# Patient Record
Sex: Female | Born: 2000 | Race: White | Hispanic: No | Marital: Single
Health system: Western US, Academic
[De-identification: ages and names within clinical notes are randomized; demographics above are authoritative.]

## PROBLEM LIST (undated history)

## (undated) ENCOUNTER — Emergency Department (HOSPITAL_COMMUNITY): Admission: EM | Payer: Medicaid Other

## (undated) DIAGNOSIS — R42 Dizziness and giddiness: Secondary | ICD-10-CM

## (undated) DIAGNOSIS — D649 Anemia, unspecified: Secondary | ICD-10-CM

## (undated) DIAGNOSIS — F32A Depression, unspecified: Secondary | ICD-10-CM

## (undated) DIAGNOSIS — R87629 Unspecified abnormal cytological findings in specimens from vagina: Secondary | ICD-10-CM

## (undated) DIAGNOSIS — E282 Polycystic ovarian syndrome: Secondary | ICD-10-CM

## (undated) DIAGNOSIS — N390 Urinary tract infection, site not specified: Secondary | ICD-10-CM

## (undated) DIAGNOSIS — J45909 Unspecified asthma, uncomplicated: Secondary | ICD-10-CM

## (undated) HISTORY — PX: NO PAST SURGERIES: SHX2092

---
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
## 2019-11-07 ENCOUNTER — Emergency Department (HOSPITAL_COMMUNITY)
Admission: EM | Admit: 2019-11-07 | Discharge: 2019-11-07 | Disposition: A | Discharge status: Home / Self Care | Payer: Medicaid Other | Attending: Emergency Medicine | Admitting: Emergency Medicine

## 2019-11-07 ENCOUNTER — Other Ambulatory Visit: Payer: Self-pay

## 2019-11-07 ENCOUNTER — Emergency Department (HOSPITAL_COMMUNITY): Payer: Medicaid Other

## 2019-11-07 ENCOUNTER — Encounter (HOSPITAL_COMMUNITY): Payer: Self-pay

## 2019-11-07 DIAGNOSIS — B3731 Acute candidiasis of vulva and vagina: Secondary | ICD-10-CM

## 2019-11-07 DIAGNOSIS — B373 Candidiasis of vulva and vagina: Secondary | ICD-10-CM | POA: Insufficient documentation

## 2019-11-07 DIAGNOSIS — O26891 Other specified pregnancy related conditions, first trimester: Secondary | ICD-10-CM | POA: Diagnosis present

## 2019-11-07 DIAGNOSIS — Z3A01 Less than 8 weeks gestation of pregnancy: Secondary | ICD-10-CM | POA: Insufficient documentation

## 2019-11-07 DIAGNOSIS — Z3491 Encounter for supervision of normal pregnancy, unspecified, first trimester: Secondary | ICD-10-CM

## 2019-11-07 DIAGNOSIS — J45909 Unspecified asthma, uncomplicated: Secondary | ICD-10-CM | POA: Diagnosis not present

## 2019-11-07 DIAGNOSIS — R1030 Lower abdominal pain, unspecified: Secondary | ICD-10-CM | POA: Diagnosis not present

## 2019-11-07 DIAGNOSIS — N760 Acute vaginitis: Secondary | ICD-10-CM | POA: Insufficient documentation

## 2019-11-07 DIAGNOSIS — B9689 Other specified bacterial agents as the cause of diseases classified elsewhere: Secondary | ICD-10-CM | POA: Insufficient documentation

## 2019-11-07 DIAGNOSIS — O26899 Other specified pregnancy related conditions, unspecified trimester: Secondary | ICD-10-CM

## 2019-11-07 HISTORY — DX: Dizziness and giddiness: R42

## 2019-11-07 HISTORY — DX: Unspecified asthma, uncomplicated: J45.909

## 2019-11-07 LAB — URINALYSIS, ROUTINE W REFLEX MICROSCOPIC
Bilirubin Urine: NEGATIVE
Glucose, UA: NEGATIVE mg/dL
Hgb urine dipstick: NEGATIVE
Ketones, ur: NEGATIVE mg/dL
Leukocytes,Ua: NEGATIVE
Nitrite: NEGATIVE
Protein, ur: NEGATIVE mg/dL
Specific Gravity, Urine: 1.012 (ref 1.005–1.030)
pH: 7 (ref 5.0–8.0)

## 2019-11-07 LAB — HCG, QUANTITATIVE, PREGNANCY: hCG, Beta Chain, Quant, S: 21583 m[IU]/mL — ABNORMAL HIGH (ref <5)

## 2019-11-07 LAB — PREGNANCY, URINE: Preg Test, Ur: POSITIVE — AB

## 2019-11-07 LAB — I-STAT BETA HCG BLOOD, ED (MC, WL, AP ONLY): I-stat hCG, quantitative: >2000 m[IU]/mL — ABNORMAL HIGH (ref <5)

## 2019-11-07 LAB — WET PREP, GENITAL
Sperm: NONE SEEN
Trich, Wet Prep: NONE SEEN

## 2019-11-07 MED ORDER — METRONIDAZOLE 0.75 % VA GEL: 1.0000 | Freq: Two times a day (BID) | VAGINAL | 0 refills | Status: DC

## 2019-11-07 MED ORDER — SODIUM CHLORIDE 0.9% FLUSH: 3.0000 mL | Freq: Once | INTRAVENOUS | Status: DC

## 2019-11-07 MED ORDER — FLUCONAZOLE 150 MG PO TABS: 150.0000 mg | ORAL_TABLET | Freq: Once | ORAL | 0 refills | Status: AC

## 2019-11-07 MED ORDER — FLUCONAZOLE 150 MG PO TABS: 150.0000 mg | ORAL_TABLET | Freq: Once | ORAL | Status: DC

## 2019-11-07 NOTE — Discharge Instructions (Addendum)
Swabs showed bacterial vaginosis and yeast. Use intravaginal gel for BV. One dose of fluconazole for yeast given here, take a repeat dose in 72 hours if vaginal discharge from yeast continues.   Ultrasound confirmed a single intrauterine pregnancy in the uterus approximately 6 week and 2 days with due date of 06/30/2020  Please go to your OB appointments as schedule, discuss recurrent BV and yeast  Return for sudden severe constant pelvic pain, vaginal bleeding

## 2019-11-07 NOTE — ED Provider Notes (Signed)
[facility]-EMERGENCY DEPT Provider Note   CSN: [id number] Arrival date & time: 11/07/19  0944     History Chief Complaint  Patient presents with  . Abdominal Pain    Anna Vance is a 19 y.o. female presents to ER for concern of ectopic pregnancy. Patient is approximately [redacted] weeks pregnant.  First day of LMP June 20th.  She reports intermittent low abdominal pain both on right and left but mostly on the right side.  This has been going on for about 2 weeks.  The pain feels like a crampy ache. It is fleeting only lasting a few seconds.  Feels mild.  Currently no pain here.  Went to ED 2 weeks ago when she was 4 weeks for this and was told they couldn't evaluate for ectopic because it was too early.  A few days ago she had vaginal discharge that felt like BV.  She used intravaginal flagyl once and then felt like she got a yeast infection from it so she stopped the flagyl.  Noticed pink tinged discharge at one point but this resolved.  Currently feels like her vaginal discharge is normal.  Reports long history of recurrent BV for 3 years. She always gets yeast infection with flagyl.  Describes frustration regarding this issue.  states only boric acid works but now can't use due to pregnancy.  Denies fever. Mild nausea, no vomiting. No urinary symptoms. No abnormal vaginal bleeding or spotting. No diarrhea or constipation. No abdominal surgeries. One previous pregnancy, son is now 49 years old.  Uncomplicated first pregnancy and delivery.  She is requesting an [place] because she is too worried about an ectopic pregnancy.  She has upcoming OBGYN appointment and [place] on 8/11 in [place] but will be moving here and has appointment with central [facility] as well. No concern for STD.  Had recent STD testing that was negative.  Sexually active without condom use.   HPI     Past Medical History:  Diagnosis Date  . Asthma   . Vertigo     There are no problems to  display for this patient.   History reviewed. No pertinent surgical history.   OB History    Gravida  1   Para      Term      Preterm      AB      Living        SAB      TAB      Ectopic      Multiple      Live Births              History reviewed. No pertinent family history.  Social History   Tobacco Use  . Smoking status: Never Smoker  . Smokeless tobacco: Never Used  Vaping Use  . Vaping Use: Never used  Substance Use Topics  . Alcohol use: Never  . Drug use: Never    Home Medications Prior to Admission medications   Medication Sig Start Date End Date Taking? Authorizing Provider  albuterol (VENTOLIN HFA) 108 (90 Base) MCG/ACT inhaler Inhale 2 puffs into the lungs every 6 (six) hours as needed for wheezing or shortness of breath.   Yes [provider]  EPINEPHrine 0.3 mg/0.3 mL IJ SOAJ injection Inject 0.3 mg into the muscle as needed for anaphylaxis.    Yes [provider]  Prenatal Vit-Fe Fumarate-FA (MULTIVITAMIN-PRENATAL) 27-0.8 MG TABS tablet Take 1 tablet by mouth daily at  12 noon.   Yes [provider]  fluconazole (DIFLUCAN) 150 MG tablet Take 1 tablet (150 mg total) by mouth once for 1 dose. Take in 72 hours if symptoms of yeast infection have not improved 11/07/19 11/07/19  Liberty HandyGibbons, Tahji Odessa J, PA-C  metroNIDAZOLE (METROGEL) 0.75 % vaginal gel Place 1 Applicatorful vaginally 2 (two) times daily. 11/07/19   Liberty HandyGibbons, Jaelee Laughter J, PA-C    Allergies    Chocolate, Other, and Penicillins  Review of Systems   Review of Systems  Genitourinary: Positive for pelvic pain (none currently).  All other systems reviewed and are negative.   Physical Exam Updated Vital Signs BP 111/69   Pulse 72   Temp 98 F (36.7 C) (Oral)   Resp 18   Ht 5\' 1"  (1.549 m)   Wt 63.5 kg   LMP 09/25/2019   SpO2 100%   BMI 26.45 kg/m   Physical Exam Vitals and nursing note reviewed.  Constitutional:      General: She is not in acute  distress.    Appearance: She is well-developed.     Comments: NAD.  HENT:     Head: Normocephalic and atraumatic.     Right Ear: External ear normal.     Left Ear: External ear normal.     Nose: Nose normal.  Eyes:     General: No scleral icterus.    Conjunctiva/sclera: Conjunctivae normal.  Cardiovascular:     Rate and Rhythm: Normal rate.  Pulmonary:     Effort: Pulmonary effort is normal.  Abdominal:     General: Abdomen is flat.     Palpations: Abdomen is soft.     Tenderness: There is no abdominal tenderness.     Comments: No lower/suprapubic/CVA tenderness   Genitourinary:    Comments:  Exam performed with RN at bedside for assistance. External genitalia without lesions.  No groin lymphadenopathy.  Vaginal mucosa and cervix pink without lesions.  Scant likely physiologic clear/white discharge in vaginal vault noted.  No CMT.  Nonpalpable, nontender adnexa.  Perianal skin normal without lesions. Musculoskeletal:        General: No deformity. Normal range of motion.     Cervical back: Normal range of motion and neck supple.  Skin:    General: Skin is warm and dry.     Capillary Refill: Capillary refill takes less than 2 seconds.  Neurological:     Mental Status: She is alert and oriented to person, place, and time.  Psychiatric:        Behavior: Behavior normal.        Thought Content: Thought content normal.        Judgment: Judgment normal.     ED Results / Procedures / Treatments   Labs (all labs ordered are listed, but only abnormal results are displayed) Labs Reviewed  WET PREP, GENITAL - Abnormal; Notable for the following components:      Result Value   Yeast Wet Prep HPF POC PRESENT (*)    Clue Cells Wet Prep HPF POC PRESENT (*)    WBC, Wet Prep HPF POC RARE (*)    All other components within normal limits  HCG, QUANTITATIVE, PREGNANCY - Abnormal; Notable for the following components:   hCG, Beta Chain, Quant, S 21,583 (*)    All other components within  normal limits  PREGNANCY, URINE - Abnormal; Notable for the following components:   Preg Test, Ur POSITIVE (*)    All other components within normal limits  I-STAT BETA  HCG BLOOD, ED ([facility], [facility], AP ONLY) - Abnormal; Notable for the following components:   I-stat hCG, quantitative >2,000.0 (*)    All other components within normal limits  URINALYSIS, ROUTINE W REFLEX MICROSCOPIC  GC/CHLAMYDIA PROBE AMP ([place]) NOT AT [facility]    EKG None  Radiology US OB Comp < 14 Wks  Result Date: 11/07/2019 CLINICAL DATA:  19 year old pregnant female with pelvic pain. LMP: 09/25/2019 corresponding to an estimated gestational age of [redacted] weeks, 1 day. EXAM: OBSTETRIC <14 WK [place] AND TRANSVAGINAL OB US TECHNIQUE: Both transabdominal and transvaginal ultrasound examinations were performed for complete evaluation of the gestation as well as the maternal uterus, adnexal regions, and pelvic cul-de-sac. Transvaginal technique was performed to assess early pregnancy. COMPARISON:  None. FINDINGS: Intrauterine gestational sac: Single intrauterine gestational sac. Yolk sac:  Seen Embryo:  Present Cardiac Activity: Detected Heart Rate: 108 bpm CRL: 5 mm   6 w   2 d                  [place] EDC: 06/30/2020 Subchorionic hemorrhage:  Small subchorionic hemorrhage. Maternal uterus/adnexae: The maternal ovaries are unremarkable. There is a corpus luteum in the right ovary. Small free fluid in the pelvis. IMPRESSION: Single live intrauterine pregnancy with an estimated gestational age of [redacted] weeks, 2 days. Electronically Signed   By: Elgie Collard M.D.   On: 11/07/2019 19:41   US OB Transvaginal  Result Date: 11/07/2019 CLINICAL DATA:  19 year old pregnant female with pelvic pain. LMP: 09/25/2019 corresponding to an estimated gestational age of [redacted] weeks, 1 day. EXAM: OBSTETRIC <14 WK [place] AND TRANSVAGINAL OB US TECHNIQUE: Both transabdominal and transvaginal ultrasound examinations were performed for complete evaluation of the gestation as  well as the maternal uterus, adnexal regions, and pelvic cul-de-sac. Transvaginal technique was performed to assess early pregnancy. COMPARISON:  None. FINDINGS: Intrauterine gestational sac: Single intrauterine gestational sac. Yolk sac:  Seen Embryo:  Present Cardiac Activity: Detected Heart Rate: 108 bpm CRL: 5 mm   6 w   2 d                  [place] EDC: 06/30/2020 Subchorionic hemorrhage:  Small subchorionic hemorrhage. Maternal uterus/adnexae: The maternal ovaries are unremarkable. There is a corpus luteum in the right ovary. Small free fluid in the pelvis. IMPRESSION: Single live intrauterine pregnancy with an estimated gestational age of [redacted] weeks, 2 days. Electronically Signed   By: Elgie Collard M.D.   On: 11/07/2019 19:41    Procedures Procedures (including critical care time)  Medications Ordered in ED Medications  sodium chloride flush (NS) 0.9 % injection 3 mL (3 mLs Intravenous Not Given 11/07/19 1736)  fluconazole (DIFLUCAN) tablet 150 mg (has no administration in time range)    ED Course  I have reviewed the triage vital signs and the nursing notes.  Pertinent labs & imaging results that were available during my care of the patient were reviewed by me and considered in my medical decision making (see chart for details).  Clinical Course as of Nov 07 2034  Mon Nov 07, 2019  1638 Yeast Wet Prep HPF POC(!): PRESENT [CG]  1638 Clue Cells Wet Prep HPF POC(!): PRESENT [CG]    Clinical Course User Index [CG] Jerrell Mylar   MDM Rules/Calculators/A&P                          19 year old female here with concern of ectopic pregnancy,  requesting ultrasound. Has fleeting pelvic pain x 2 weeks.   Vital signs normal.  She has no current abdominal or pelvic pain here.  Abdominal and pelvic exam benign.  She has no red flags like fever, abnormal vaginal discharge, abnormal vaginal bleeding.  No history of ectopic pregnancy in the past.  Uncomplicated first pregnancy and  delivery.  Based on her LMP she is approximately [redacted] weeks gestational age.  I discussed with patient given her lack of symptoms, current pain and gestational age ectopic pregnancy is very unlikely.  Also, unlikely to visualize anything on ultrasound.  She has scheduled ultrasound for next week.  However, patient states she is here for ultrasound and would like one to feel better about it.  Ultrasound ordered.   ER work up personally interpreted.  Wet prep with yeast and clue cells.  History of recurrent BV and yeast, will give metronidazole intravaginal gel and fluconazole.  Urinalysis obtained in triage without any bacteria.  She declined STD treatment.  hCG quant is greater than 21,000.  OB ultrasound pending.  Anticipate discharge.  She has OB follow-up.  Return precautions discussed.  2035: [place] confirms single IUP with cardiac activity, small subchorionic hemorrhage.  Discussed with patient. She is to fu with OBGYN as scheduled. Recommended tylenol and activity modification for pelvic pain. Return precautions given.    Final Clinical Impression(s) / ED Diagnoses Final diagnoses:  Pelvic pain in pregnancy  First trimester pregnancy  Bacterial vaginosis  Yeast vaginitis    Rx / DC Orders ED Discharge Orders         Ordered    metroNIDAZOLE (METROGEL) 0.75 % vaginal gel  2 times daily     Discontinue  Reprint     11/07/19 2014    fluconazole (DIFLUCAN) 150 MG tablet   Once     Discontinue  Reprint     11/07/19 2014           Liberty Handy, PA-C 11/07/19 2036    Virgina Norfolk, DO 11/07/19 2059

## 2019-11-07 NOTE — ED Notes (Signed)
US at bedside

## 2019-11-07 NOTE — ED Triage Notes (Signed)
Patient c/o right abdominal pain x 2 days. Patient states she is [redacted] weeks pregnant.

## 2019-11-08 LAB — GC/CHLAMYDIA PROBE AMP (~~LOC~~) NOT AT ARMC
Chlamydia: NEGATIVE
Comment: NEGATIVE
Comment: NORMAL
Neisseria Gonorrhea: NEGATIVE

## 2019-12-13 LAB — OB RESULTS CONSOLE RPR
RPR: NONREACTIVE
RPR: NONREACTIVE
RPR: NONREACTIVE

## 2019-12-13 LAB — OB RESULTS CONSOLE HEPATITIS B SURFACE ANTIGEN
Hepatitis B Surface Ag: NEGATIVE
Hepatitis B Surface Ag: NEGATIVE
Hepatitis B Surface Ag: NEGATIVE
Hepatitis B Surface Ag: NEGATIVE

## 2019-12-13 LAB — OB RESULTS CONSOLE GC/CHLAMYDIA
Chlamydia: NEGATIVE
Chlamydia: NEGATIVE
Chlamydia: NEGATIVE
Gonorrhea: NEGATIVE
Gonorrhea: NEGATIVE
Gonorrhea: NEGATIVE

## 2019-12-13 LAB — OB RESULTS CONSOLE RUBELLA ANTIBODY, IGM
Rubella: IMMUNE
Rubella: IMMUNE
Rubella: IMMUNE
Rubella: IMMUNE

## 2019-12-13 LAB — OB RESULTS CONSOLE HIV ANTIBODY (ROUTINE TESTING)
HIV: NONREACTIVE
HIV: NONREACTIVE
HIV: NONREACTIVE

## 2019-12-13 LAB — OB RESULTS CONSOLE ABO/RH: RH Type: POSITIVE

## 2019-12-13 LAB — OB RESULTS CONSOLE ANTIBODY SCREEN: Antibody Screen: NEGATIVE

## 2019-12-30 ENCOUNTER — Encounter (HOSPITAL_COMMUNITY): Payer: Self-pay | Admitting: Obstetrics and Gynecology

## 2019-12-30 ENCOUNTER — Other Ambulatory Visit: Payer: Self-pay

## 2019-12-30 ENCOUNTER — Inpatient Hospital Stay (HOSPITAL_COMMUNITY)
Admission: AD | Admit: 2019-12-30 | Discharge: 2019-12-31 | Disposition: A | Discharge status: Home / Self Care | Payer: Medicaid Other | Attending: Obstetrics and Gynecology | Admitting: Obstetrics and Gynecology

## 2019-12-30 DIAGNOSIS — O219 Vomiting of pregnancy, unspecified: Secondary | ICD-10-CM | POA: Insufficient documentation

## 2019-12-30 DIAGNOSIS — O99891 Other specified diseases and conditions complicating pregnancy: Secondary | ICD-10-CM

## 2019-12-30 DIAGNOSIS — J45909 Unspecified asthma, uncomplicated: Secondary | ICD-10-CM | POA: Insufficient documentation

## 2019-12-30 DIAGNOSIS — Z79899 Other long term (current) drug therapy: Secondary | ICD-10-CM | POA: Insufficient documentation

## 2019-12-30 DIAGNOSIS — O99511 Diseases of the respiratory system complicating pregnancy, first trimester: Secondary | ICD-10-CM | POA: Insufficient documentation

## 2019-12-30 DIAGNOSIS — O26891 Other specified pregnancy related conditions, first trimester: Secondary | ICD-10-CM | POA: Insufficient documentation

## 2019-12-30 DIAGNOSIS — Z3A13 13 weeks gestation of pregnancy: Secondary | ICD-10-CM | POA: Insufficient documentation

## 2019-12-30 DIAGNOSIS — O26811 Pregnancy related exhaustion and fatigue, first trimester: Secondary | ICD-10-CM

## 2019-12-30 DIAGNOSIS — R519 Headache, unspecified: Secondary | ICD-10-CM | POA: Insufficient documentation

## 2019-12-30 DIAGNOSIS — M549 Dorsalgia, unspecified: Secondary | ICD-10-CM

## 2019-12-30 LAB — URINALYSIS, ROUTINE W REFLEX MICROSCOPIC
Bacteria, UA: NONE SEEN
Bilirubin Urine: NEGATIVE
Glucose, UA: NEGATIVE mg/dL
Hgb urine dipstick: NEGATIVE
Ketones, ur: NEGATIVE mg/dL
Leukocytes,Ua: NEGATIVE
Nitrite: NEGATIVE
Protein, ur: NEGATIVE mg/dL
Specific Gravity, Urine: 1.01 (ref 1.005–1.030)
pH: 6 (ref 5.0–8.0)

## 2019-12-30 NOTE — MAU Provider Note (Signed)
History     CSN: [id number]  Arrival date and time: 12/30/19 2036   First Provider Initiated Contact with Patient 12/30/19 2359      Chief Complaint  Patient presents with  . Emesis  . Headache   Anna Vance is a 19 y.o. G2P1001 at [redacted]w[redacted]d who receives care at [facility].  She presents today for Emesis and Headache.  She states she has been experiencing N/V on and off for a few weeks.  She states for the past 2 days it has worsened and she called her office.  She reports she was sent in Diclegis, but did not pick it up today.  She states she has tried Vitamin B6 and Unisom without relief.  She states she has been hydrating without issues, but has been unable to eat anything.  She reports she tried to eat a biscuit prior to arrival and grapes earlier.  She states she had vomiting after the grapes, but not after the biscuit.  Patient states she "just feels bad."  She reports she has been having dizzy spells that make her feel "hot all over and like I am about to pass out."  Patient states this has been ongoing.  Patient reports she worked yesterday and had a dizzy spell that resolved with "leaning over."     OB History    Gravida  2   Para  1   Term      Preterm      AB      Living  1     SAB      TAB      Ectopic      Multiple      Live Births              Past Medical History:  Diagnosis Date  . Asthma   . Vertigo     Past Surgical History:  Procedure Laterality Date  . NO PAST SURGERIES      No family history on file.  Social History   Tobacco Use  . Smoking status: Never Smoker  . Smokeless tobacco: Never Used  Vaping Use  . Vaping Use: Never used  Substance Use Topics  . Alcohol use: Never  . Drug use: Never    Allergies:  Allergies  Allergen Reactions  . Chocolate   . Other     Peanuts and strawberries  . Penicillins     Did it involve swelling of the face/tongue/throat, SOB, or low BP? Unknown Did it  involve sudden or severe rash/hives, skin peeling, or any reaction on the inside of your mouth or nose? Unknown Did you need to seek medical attention at a hospital or doctor's office? Unknown When did it last happen?child If all above answers are "NO", may proceed with cephalosporin use.      Medications Prior to Admission  Medication Sig Dispense Refill Last Dose  . Prenatal Vit-Fe Fumarate-FA (MULTIVITAMIN-PRENATAL) 27-0.8 MG TABS tablet Take 1 tablet by mouth daily at 12 noon.   12/30/2019 at Unknown time  . albuterol (VENTOLIN HFA) 108 (90 Base) MCG/ACT inhaler Inhale 2 puffs into the lungs every 6 (six) hours as needed for wheezing or shortness of breath.   Unknown at Unknown time  . EPINEPHrine 0.3 mg/0.3 mL IJ SOAJ injection Inject 0.3 mg into the muscle as needed for anaphylaxis.      Marland Kitchen metroNIDAZOLE (METROGEL) 0.75 % vaginal gel Place 1 Applicatorful vaginally 2 (two) times daily. 70 g  0     Review of Systems  Constitutional: Negative for chills and fever.  Respiratory: Negative for cough and shortness of breath.   Gastrointestinal: Positive for constipation, nausea and vomiting. Negative for diarrhea.  Genitourinary: Negative for difficulty urinating, dysuria, vaginal bleeding and vaginal discharge.  Neurological: Positive for dizziness and headaches (6/10). Negative for light-headedness.   Physical Exam   Blood pressure (!) 109/54, pulse 87, temperature 98.7 F (37.1 C), temperature source Oral, resp. rate 17, height 5\' 2"  (1.575 m), weight 60.3 kg, last menstrual period 09/25/2019, SpO2 100 %.  Physical Exam Vitals and nursing note reviewed.  Constitutional:      Appearance: Normal appearance. She is well-developed.  HENT:     Head: Normocephalic and atraumatic.  Eyes:     Conjunctiva/sclera: Conjunctivae normal.  Cardiovascular:     Rate and Rhythm: Normal rate and regular rhythm.     Heart sounds: Normal heart sounds.  Pulmonary:     Effort: Pulmonary  effort is normal. No respiratory distress.     Breath sounds: Normal breath sounds.  Musculoskeletal:        General: Normal range of motion.     Cervical back: Normal range of motion.  Skin:    General: Skin is warm and dry.  Neurological:     Mental Status: She is alert, oriented to person, place, and time and easily aroused.  Psychiatric:        Mood and Affect: Mood normal.        Behavior: Behavior normal.        Thought Content: Thought content normal.     MAU Course  Procedures Results for orders placed or performed during the hospital encounter of 12/30/19 (from the past 24 hour(s))  Urinalysis, Routine w reflex microscopic     Status: Abnormal   Collection Time: 12/30/19  8:41 PM  Result Value Ref Range   Color, Urine YELLOW YELLOW   APPearance HAZY (A) CLEAR   Specific Gravity, Urine 1.010 1.005 - 1.030   pH 6.0 5.0 - 8.0   Glucose, UA NEGATIVE NEGATIVE mg/dL   Hgb urine dipstick NEGATIVE NEGATIVE   Bilirubin Urine NEGATIVE NEGATIVE   Ketones, ur NEGATIVE NEGATIVE mg/dL   Protein, ur NEGATIVE NEGATIVE mg/dL   Nitrite NEGATIVE NEGATIVE   Leukocytes,Ua NEGATIVE NEGATIVE   WBC, UA 0-5 0 - 5 WBC/hpf   Bacteria, UA NONE SEEN NONE SEEN   Squamous Epithelial / LPF 11-20 0 - 5   Mucus PRESENT     MDM Physical Exam AntiEmetic-Oral Analgesic Muscle Relaxant Assessment and Plan  19 year old, G2P1001 SIUP at 13.5 weeks Nausea/Vomiting Fatigue Headache  -POC Reviewed. -Will give Zofran f/b crackers and ginger ale. -If tolerated will give Flexeril and tylenol. -Patient encouraged to rest as she appears fatigued. -Will monitor and reassess.   26 12/30/2019, 11:59 PM   Reassessment (1:27 AM)  -Patient reports improvement in symptoms. -Will send script for Zofran. -Discussed usage of tylenol prior to work. -Reviewd usage of back or belly support band for work. -Patient without further questions or concerns. -Encouraged rest. Given OOW excuse  until Monday Sept 27th.  -Encouraged to call or return to MAU if symptoms worsen or with the onset of new symptoms. -Discharged to home in stable condition.  12-07-1997 MSN, CNM Advanced Practice Provider, [facility]

## 2019-12-30 NOTE — MAU Note (Signed)
Pt reports nausea and "lethargic" for the last 2 days. Unable to keep anything down since yesterday. Has had nausea and vomiting throughout pregnancy. Headache all day today

## 2019-12-31 DIAGNOSIS — Z79899 Other long term (current) drug therapy: Secondary | ICD-10-CM | POA: Diagnosis not present

## 2019-12-31 DIAGNOSIS — R519 Headache, unspecified: Secondary | ICD-10-CM | POA: Diagnosis not present

## 2019-12-31 DIAGNOSIS — O26891 Other specified pregnancy related conditions, first trimester: Secondary | ICD-10-CM | POA: Diagnosis not present

## 2019-12-31 DIAGNOSIS — O99511 Diseases of the respiratory system complicating pregnancy, first trimester: Secondary | ICD-10-CM | POA: Diagnosis not present

## 2019-12-31 DIAGNOSIS — Z3A13 13 weeks gestation of pregnancy: Secondary | ICD-10-CM | POA: Diagnosis not present

## 2019-12-31 DIAGNOSIS — J45909 Unspecified asthma, uncomplicated: Secondary | ICD-10-CM | POA: Diagnosis not present

## 2019-12-31 DIAGNOSIS — O219 Vomiting of pregnancy, unspecified: Secondary | ICD-10-CM | POA: Diagnosis present

## 2019-12-31 DIAGNOSIS — O26811 Pregnancy related exhaustion and fatigue, first trimester: Secondary | ICD-10-CM | POA: Diagnosis not present

## 2019-12-31 DIAGNOSIS — O99891 Other specified diseases and conditions complicating pregnancy: Secondary | ICD-10-CM | POA: Diagnosis not present

## 2019-12-31 MED ORDER — ONDANSETRON 4 MG PO TBDP
8.0000 mg | ORAL_TABLET | Freq: Once | ORAL | Status: AC
  Administered 2019-12-31: 8 mg via ORAL
  Filled 2019-12-31: qty 2

## 2019-12-31 MED ORDER — ONDANSETRON 4 MG PO TBDP: 4.0000 mg | ORAL_TABLET | Freq: Three times a day (TID) | ORAL | 0 refills | Status: DC | PRN

## 2019-12-31 MED ORDER — ACETAMINOPHEN 500 MG PO TABS
1000.0000 mg | ORAL_TABLET | Freq: Once | ORAL | Status: AC
  Administered 2019-12-31: 1000 mg via ORAL
  Filled 2019-12-31: qty 2

## 2019-12-31 MED ORDER — CYCLOBENZAPRINE HCL 5 MG PO TABS
10.0000 mg | ORAL_TABLET | Freq: Once | ORAL | Status: AC
  Administered 2019-12-31: 10 mg via ORAL
  Filled 2019-12-31: qty 2

## 2019-12-31 NOTE — Discharge Instructions (Signed)
Acute Back Pain, Adult Acute back pain is sudden and usually short-lived. It is often caused by an injury to the muscles and tissues in the back. The injury may result from:  A muscle or ligament getting overstretched or torn (strained). Ligaments are tissues that connect bones to each other. Lifting something improperly can cause a back strain.  Wear and tear (degeneration) of the spinal disks. Spinal disks are circular tissue that provides cushioning between the bones of the spine (vertebrae).  Twisting motions, such as while playing sports or doing yard work.  A hit to the back.  Arthritis. You may have a physical exam, lab tests, and imaging tests to find the cause of your pain. Acute back pain usually goes away with rest and home care. Follow these instructions at home: Managing pain, stiffness, and swelling  Take over-the-counter and prescription medicines only as told by your health care provider.  Your health care provider may recommend applying ice during the first 24-48 hours after your pain starts. To do this: ? Put ice in a plastic bag. ? Place a towel between your skin and the bag. ? Leave the ice on for 20 minutes, 2-3 times a day.  If directed, apply heat to the affected area as often as told by your health care provider. Use the heat source that your health care provider recommends, such as a moist heat pack or a heating pad. ? Place a towel between your skin and the heat source. ? Leave the heat on for 20-30 minutes. ? Remove the heat if your skin turns bright red. This is especially important if you are unable to feel pain, heat, or cold. You have a greater risk of getting burned. Activity   Do not stay in bed. Staying in bed for more than 1-2 days can delay your recovery.  Sit up and stand up straight. Avoid leaning forward when you sit, or hunching over when you stand. ? If you work at a desk, sit close to it so you do not need to lean over. Keep your chin tucked  in. Keep your neck drawn back, and keep your elbows bent at a right angle. Your arms should look like the letter "L." ? Sit high and close to the steering wheel when you drive. Add lower back (lumbar) support to your car seat, if needed.  Take short walks on even surfaces as soon as you are able. Try to increase the length of time you walk each day.  Do not sit, drive, or stand in one place for more than 30 minutes at a time. Sitting or standing for long periods of time can put stress on your back.  Do not drive or use heavy machinery while taking prescription pain medicine.  Use proper lifting techniques. When you bend and lift, use positions that put less stress on your back: ? Bend your knees. ? Keep the load close to your body. ? Avoid twisting.  Exercise regularly as told by your health care provider. Exercising helps your back heal faster and helps prevent back injuries by keeping muscles strong and flexible.  Work with a physical therapist to make a safe exercise program, as recommended by your health care provider. Do any exercises as told by your physical therapist. Lifestyle  Maintain a healthy weight. Extra weight puts stress on your back and makes it difficult to have good posture.  Avoid activities or situations that make you feel anxious or stressed. Stress and anxiety increase muscle   tension and can make back pain worse. Learn ways to manage anxiety and stress, such as through exercise. General instructions  Sleep on a firm mattress in a comfortable position. Try lying on your side with your knees slightly bent. If you lie on your back, put a pillow under your knees.  Follow your treatment plan as told by your health care provider. This may include: ? Cognitive or behavioral therapy. ? Acupuncture or massage therapy. ? Meditation or yoga. Contact a health care provider if:  You have pain that is not relieved with rest or medicine.  You have increasing pain going down  into your legs or buttocks.  Your pain does not improve after 2 weeks.  You have pain at night.  You lose weight without trying.  You have a fever or chills. Get help right away if:  You develop new bowel or bladder control problems.  You have unusual weakness or numbness in your arms or legs.  You develop nausea or vomiting.  You develop abdominal pain.  You feel faint. Summary  Acute back pain is sudden and usually short-lived.  Use proper lifting techniques. When you bend and lift, use positions that put less stress on your back.  Take over-the-counter and prescription medicines and apply heat or ice as directed by your health care provider. This information is not intended to replace advice given to you by your health care provider. Make sure you discuss any questions you have with your health care provider. Document Revised: 07/13/2018 Document Reviewed: 11/05/2016 Elsevier Patient Education  2020 [employer]  

## 2020-01-12 LAB — OB RESULTS CONSOLE GC/CHLAMYDIA
Chlamydia: NEGATIVE
Gonorrhea: NEGATIVE

## 2020-02-22 ENCOUNTER — Other Ambulatory Visit: Payer: Self-pay | Admitting: Obstetrics and Gynecology

## 2020-02-22 ENCOUNTER — Telehealth (HOSPITAL_COMMUNITY): Payer: Self-pay | Admitting: *Deleted

## 2020-02-22 NOTE — Telephone Encounter (Signed)
Preadmission screen Arrive at 1245. NPO after midnight.  Verbalized understanding.

## 2020-02-23 ENCOUNTER — Other Ambulatory Visit (HOSPITAL_COMMUNITY)
Admission: RE | Admit: 2020-02-23 | Discharge: 2020-02-23 | Disposition: A | Discharge status: Home / Self Care | Payer: Medicaid Other | Source: Ambulatory Visit | Attending: Obstetrics and Gynecology | Admitting: Obstetrics and Gynecology

## 2020-02-23 DIAGNOSIS — Z20822 Contact with and (suspected) exposure to covid-19: Secondary | ICD-10-CM | POA: Diagnosis not present

## 2020-02-23 DIAGNOSIS — Z01812 Encounter for preprocedural laboratory examination: Secondary | ICD-10-CM | POA: Diagnosis present

## 2020-02-23 LAB — SARS CORONAVIRUS 2 (TAT 6-24 HRS): SARS Coronavirus 2: NEGATIVE

## 2020-02-24 ENCOUNTER — Encounter (HOSPITAL_COMMUNITY): Payer: Self-pay | Admitting: Obstetrics and Gynecology

## 2020-02-24 ENCOUNTER — Observation Stay (HOSPITAL_COMMUNITY)
Admission: RE | Admit: 2020-02-24 | Discharge: 2020-02-24 | Disposition: A | Discharge status: Home / Self Care | Payer: Medicaid Other | Attending: Obstetrics and Gynecology | Admitting: Obstetrics and Gynecology

## 2020-02-24 ENCOUNTER — Observation Stay (HOSPITAL_COMMUNITY): Payer: Medicaid Other | Admitting: Anesthesiology

## 2020-02-24 ENCOUNTER — Encounter (HOSPITAL_COMMUNITY)
Admission: RE | Disposition: A | Discharge status: Home / Self Care | Payer: Self-pay | Source: Home / Self Care | Attending: Obstetrics and Gynecology

## 2020-02-24 ENCOUNTER — Other Ambulatory Visit: Payer: Self-pay

## 2020-02-24 DIAGNOSIS — J45909 Unspecified asthma, uncomplicated: Secondary | ICD-10-CM | POA: Insufficient documentation

## 2020-02-24 DIAGNOSIS — O99512 Diseases of the respiratory system complicating pregnancy, second trimester: Secondary | ICD-10-CM | POA: Diagnosis not present

## 2020-02-24 DIAGNOSIS — Z3A21 21 weeks gestation of pregnancy: Secondary | ICD-10-CM | POA: Diagnosis not present

## 2020-02-24 DIAGNOSIS — O3432 Maternal care for cervical incompetence, second trimester: Principal | ICD-10-CM | POA: Diagnosis present

## 2020-02-24 HISTORY — PX: CERVICAL CERCLAGE: SHX1329

## 2020-02-24 LAB — CBC
HCT: 36.8 % (ref 36.0–46.0)
Hemoglobin: 12.5 g/dL (ref 12.0–15.0)
MCH: 33.5 pg (ref 26.0–34.0)
MCHC: 34 g/dL (ref 30.0–36.0)
MCV: 98.7 fL (ref 80.0–100.0)
Platelets: 209 10*3/uL (ref 150–400)
RBC: 3.73 MIL/uL — ABNORMAL LOW (ref 3.87–5.11)
RDW: 13.3 % (ref 11.5–15.5)
WBC: 6.3 10*3/uL (ref 4.0–10.5)
nRBC: 0 % (ref 0.0–0.2)

## 2020-02-24 SURGERY — CERCLAGE, CERVIX, VAGINAL APPROACH
Anesthesia: Spinal

## 2020-02-24 MED ORDER — SODIUM CHLORIDE (PF) 0.9 % IJ SOLN
Freq: Once | INTRAMUSCULAR | Status: DC
  Filled 2020-02-24: qty 1

## 2020-02-24 MED ORDER — MEPERIDINE HCL 25 MG/ML IJ SOLN: 6.2500 mg | INTRAMUSCULAR | Status: DC | PRN

## 2020-02-24 MED ORDER — FENTANYL CITRATE (PF) 100 MCG/2ML IJ SOLN
INTRAMUSCULAR | Status: AC
  Filled 2020-02-24: qty 2

## 2020-02-24 MED ORDER — ONDANSETRON HCL 4 MG/2ML IJ SOLN: 4.0000 mg | Freq: Once | INTRAMUSCULAR | Status: DC | PRN

## 2020-02-24 MED ORDER — OXYCODONE HCL 5 MG/5ML PO SOLN: 5.0000 mg | Freq: Once | ORAL | Status: DC | PRN

## 2020-02-24 MED ORDER — ONDANSETRON HCL 4 MG/2ML IJ SOLN
INTRAMUSCULAR | Status: DC | PRN
  Administered 2020-02-24: 4 mg via INTRAVENOUS

## 2020-02-24 MED ORDER — FENTANYL CITRATE (PF) 100 MCG/2ML IJ SOLN
INTRAMUSCULAR | Status: DC | PRN
Start: 2020-02-24 — End: 2020-02-24
  Administered 2020-02-24: 15 ug via INTRATHECAL

## 2020-02-24 MED ORDER — SOD CITRATE-CITRIC ACID 500-334 MG/5ML PO SOLN: 30.0000 mL | ORAL | Status: DC

## 2020-02-24 MED ORDER — FENTANYL CITRATE (PF) 100 MCG/2ML IJ SOLN
INTRAMUSCULAR | Status: DC | PRN
  Administered 2020-02-24 (×2): 50 ug via INTRAVENOUS

## 2020-02-24 MED ORDER — ACETAMINOPHEN 160 MG/5ML PO SOLN: 325.0000 mg | ORAL | Status: DC | PRN

## 2020-02-24 MED ORDER — CHLOROPROCAINE HCL 50 MG/5ML IT SOLN
INTRATHECAL | Status: DC | PRN
  Administered 2020-02-24: 50 mg via INTRATHECAL

## 2020-02-24 MED ORDER — LACTATED RINGERS IV SOLN: INTRAVENOUS | Status: DC | PRN

## 2020-02-24 MED ORDER — SODIUM CHLORIDE (PF) 0.9 % IJ SOLN: INTRAMUSCULAR | Status: DC | PRN

## 2020-02-24 MED ORDER — CHLOROPROCAINE HCL 50 MG/5ML IT SOLN
INTRATHECAL | Status: AC
  Filled 2020-02-24: qty 5

## 2020-02-24 MED ORDER — ONDANSETRON HCL 4 MG/2ML IJ SOLN
INTRAMUSCULAR | Status: AC
  Filled 2020-02-24: qty 2

## 2020-02-24 MED ORDER — ACETAMINOPHEN 325 MG PO TABS: 325.0000 mg | ORAL_TABLET | ORAL | Status: DC | PRN

## 2020-02-24 MED ORDER — FENTANYL CITRATE (PF) 250 MCG/5ML IJ SOLN
INTRAMUSCULAR | Status: AC
  Filled 2020-02-24: qty 5

## 2020-02-24 MED ORDER — OXYCODONE HCL 5 MG PO TABS: 5.0000 mg | ORAL_TABLET | Freq: Once | ORAL | Status: DC | PRN

## 2020-02-24 MED ORDER — FENTANYL CITRATE (PF) 100 MCG/2ML IJ SOLN: 25.0000 ug | INTRAMUSCULAR | Status: DC | PRN

## 2020-02-24 SURGICAL SUPPLY — 18 items
CANISTER SUCT 3000ML PPV (MISCELLANEOUS) ×2 IMPLANT
GLOVE BIO SURGEON STRL SZ7.5 (GLOVE) ×2 IMPLANT
GLOVE BIOGEL PI IND STRL 7.0 (GLOVE) ×1 IMPLANT
GLOVE BIOGEL PI IND STRL 7.5 (GLOVE) ×1 IMPLANT
GLOVE BIOGEL PI INDICATOR 7.0 (GLOVE) ×1
GLOVE BIOGEL PI INDICATOR 7.5 (GLOVE) ×1
GOWN STRL REUS W/TWL LRG LVL3 (GOWN DISPOSABLE) ×4 IMPLANT
NS IRRIG 1000ML POUR BTL (IV SOLUTION) ×2 IMPLANT
PACK VAGINAL MINOR WOMEN LF (CUSTOM PROCEDURE TRAY) ×2 IMPLANT
PAD OB MATERNITY 4.3X12.25 (PERSONAL CARE ITEMS) ×2 IMPLANT
PAD PREP 24X48 CUFFED NSTRL (MISCELLANEOUS) ×2 IMPLANT
SUT MERSILENE 5MM BP 1 12 (SUTURE) ×2 IMPLANT
SUT PROLENE 1 CT 1 30 (SUTURE) ×2 IMPLANT
SYR BULB IRRIGATION 50ML (SYRINGE) ×2 IMPLANT
TOWEL OR 17X24 6PK STRL BLUE (TOWEL DISPOSABLE) ×2 IMPLANT
TRAY FOLEY W/BAG SLVR 14FR (SET/KITS/TRAYS/PACK) ×2 IMPLANT
TUBING NON-CON 1/4 X 20 CONN (TUBING) IMPLANT
YANKAUER SUCT BULB TIP NO VENT (SUCTIONS) IMPLANT

## 2020-02-24 NOTE — Anesthesia Preprocedure Evaluation (Addendum)
Anesthesia Evaluation  Patient identified by MRN, date of birth, ID band Patient awake    Reviewed: Allergy & Precautions, H&P , NPO status , Patient's Chart, lab work & pertinent test results, reviewed documented beta blocker date and time   Airway Mallampati: I  TM Distance: >3 FB Neck ROM: full    Dental no notable dental hx. (+) Teeth Intact, Dental Advisory Given   Pulmonary asthma ,    Pulmonary exam normal breath sounds clear to auscultation       Cardiovascular negative cardio ROS Normal cardiovascular exam Rhythm:regular Rate:Normal     Neuro/Psych negative neurological ROS  negative psych ROS   GI/Hepatic negative GI ROS, Neg liver ROS,   Endo/Other  negative endocrine ROS  Renal/GU negative Renal ROS  negative genitourinary   Musculoskeletal   Abdominal   Peds  Hematology negative hematology ROS (+)   Anesthesia Other Findings   Reproductive/Obstetrics (+) Pregnancy                            Anesthesia Physical Anesthesia Plan  ASA: II  Anesthesia Plan: Spinal   Post-op Pain Management:    Induction:   PONV Risk Score and Plan:   Airway Management Planned: Natural Airway  Additional Equipment:   Intra-op Plan:   Post-operative Plan:   Informed Consent: I have reviewed the patients History and Physical, chart, labs and discussed the procedure including the risks, benefits and alternatives for the proposed anesthesia with the patient or authorized representative who has indicated his/her understanding and acceptance.       Plan Discussed with: Anesthesiologist and CRNA  Anesthesia Plan Comments: (  )        Anesthesia Quick Evaluation

## 2020-02-24 NOTE — Op Note (Signed)
Preop Diagnosis: incompetent cervix   Postop Diagnosis: cerclage cervical incompetent cervix    Procedure: CERCLAGE CERVICAL   Anesthesia: Choice   Anesthesiologist: Dr. Miguel Rota   Attending: Osborn Coho, MD   Assistant: N/a  Findings: Closed cervix  Pathology: N/a  Fluids: 800 cc  UOP: 50 cc  EBL: 5cc  Complications: None  Procedure:Then patient was taken to the operating room after the risks, benefits and alternatives discussed with the patient and consent signed and witnessed.  The patient was given a spinal per anesthesia and placed in the dorsal lithotomy position.  The patient was prepped and draped in the usual sterile fashion.  A cervical cerclage stitch was placed using Mersilene and the knot was tied anteriorly on the cervix with a stitch of 1 prolene at the base to help elevate knot if necessary when it comes time for removal.  Clindamycin douche was performed.  Membranes remained intact and post procedure fetal heart rate was 164.  Sponge, lap and needle count was correct and the patient was transferred to the recovery room in good condition.

## 2020-02-24 NOTE — Transfer of Care (Signed)
Immediate Anesthesia Transfer of Care Note  Patient: Anna Vance  Procedure(s) Performed: CERCLAGE CERVICAL (N/A )  Patient Location: PACU  Anesthesia Type:Spinal  Level of Consciousness: awake, alert  and oriented  Airway & Oxygen Therapy: Patient Spontanous Breathing  Post-op Assessment: Report given to RN and Post -op Vital signs reviewed and stable  Post vital signs: Reviewed and stable  Last Vitals:  Vitals Value Taken Time  BP 99/54 02/24/20 1556  Temp    Pulse 62 02/24/20 1557  Resp 13 02/24/20 1557  SpO2 100 % 02/24/20 1557  Vitals shown include unvalidated device data.  Last Pain:  Vitals:   02/24/20 1314  TempSrc: Oral         Complications: No complications documented.

## 2020-02-24 NOTE — H&P (Addendum)
PRE-OP HISTORY & PHYSICAL:  Admission Date: 02/24/2020 12:47 PM  Admit Diagnosis: shortened cervical length  Anna Vance is a 19 y.o. female G2P1 [redacted]w[redacted]d presenting for cerclage placement for shortened cervical length in pregnancy. Shortened cervix identified at 19 weeks, 2.1-2.3 cm started vaginal progesterone, but unable to avoid and did not begin therapy.  Cervix shortened to 1.2-1.7 cm with funneling as noted on 02/22/20.  History of current pregnancy: G2P1   Patient entered care with [facility] at 10+6 wks.   EDC 07/01/20 by [place] @ 6+2 wks   Significant prenatal events:  Patient Active Problem List   Diagnosis Date Noted  . Incompetent cervix during second trimester, antepartum 02/24/2020    Prenatal Labs: ABO, Rh:  O Antibody:  pos Rubella:   immune RPR:   NR HBsAg:   NR HIV:   NR GC/CHL: neg/neg Genetics: low-risk female, Horizon neg    OB History  Gravida Para Term Preterm AB Living  2 1       1   SAB TAB Ectopic Multiple Live Births               # Outcome Date GA Lbr Len/2nd Weight Sex Delivery Anes PTL Lv  2 Current           1 Para             Medical / Surgical History: Past medical history:  Past Medical History:  Diagnosis Date  . Asthma   . Vertigo     Past surgical history:  Past Surgical History:  Procedure Laterality Date  . NO PAST SURGERIES     Family History: No family history on file.  Social History:  reports that she has never smoked. She has never used smokeless tobacco. She reports that she does not drink alcohol and does not use drugs.  Allergies: Chocolate, Other, and Penicillins   Current Medications at time of admission:  Prior to Admission medications   Medication Sig Start Date End Date Taking? Authorizing Provider  albuterol (VENTOLIN HFA) 108 (90 Base) MCG/ACT inhaler Inhale 2 puffs into the lungs every 6 (six) hours as needed for wheezing or shortness of breath.   Yes [provider]  EPINEPHrine 0.3 mg/0.3  mL IJ SOAJ injection Inject 0.3 mg into the muscle as needed for anaphylaxis.    Yes [provider]  Prenatal Vit-Fe Fumarate-FA (MULTIVITAMIN-PRENATAL) 27-0.8 MG TABS tablet Take 1 tablet by mouth daily at 12 noon.   Yes [provider]  ondansetron (ZOFRAN ODT) 4 MG disintegrating tablet Take 1-2 tablets (4-8 mg total) by mouth every 8 (eight) hours as needed for nausea or vomiting. Patient not taking: Reported on 02/22/2020 12/31/19   01/02/20, CNM    Review of Systems: Constitutional: Negative   HENT: Negative   Eyes: Negative   Respiratory: Negative   Cardiovascular: Negative   Gastrointestinal: Negative  Genitourinary: neg for bloody show, neg for LOF   Musculoskeletal: Negative   Skin: Negative   Neurological: Negative   Endo/Heme/Allergies: Negative   Psychiatric/Behavioral: Negative    Physical Exam: VS: Blood pressure 101/62, pulse 77, temperature 98.6 F (37 C), temperature source Oral, resp. rate 16, height 5\' 2"  (1.575 m), weight 63.5 kg, last menstrual period 09/25/2019, SpO2 100 %. AAO x3, no signs of distress Cardiovascular: RRR Respiratory: unlabored GU/GI: Abdomen gravid, non-tender, non-distended Extremities: negative for pain, tenderness, and cords   Assessment: 19 y.o. G2P1 [redacted]w[redacted]d  Shortened cervical length Pain management plan: per anesthesia  Plan:  Admit to L&D OR  Routine pre-op orders  Dr Su Hilt aware of admission and plan of care  Roma Schanz MSN, CNM 02/24/2020 2:45 PM  Agree with above risks, benefits alternatives including but not limited to bleeding infection injury risk of ruptured membranes with risk to loss of pregnancy.

## 2020-02-24 NOTE — Anesthesia Postprocedure Evaluation (Signed)
Anesthesia Post Note  Patient: Analiya Porco  Procedure(s) Performed: CERCLAGE CERVICAL (N/A )     Patient location during evaluation: PACU Anesthesia Type: Spinal Level of consciousness: oriented and awake and alert Pain management: pain level controlled Vital Signs Assessment: post-procedure vital signs reviewed and stable Respiratory status: spontaneous breathing, respiratory function stable and patient connected to nasal cannula oxygen Cardiovascular status: blood pressure returned to baseline and stable Postop Assessment: no headache, no backache and no apparent nausea or vomiting Anesthetic complications: no   No complications documented.  Last Vitals:  Vitals:   02/24/20 1700 02/24/20 1715  BP: 111/63 106/69  Pulse: 65 68  Resp: 18 19  Temp:  37.3 C  SpO2: 100% 100%    Last Pain:  Vitals:   02/24/20 1715  TempSrc: Oral  PainSc:                  Xavious Sharrar

## 2020-03-13 NOTE — Discharge Summary (Signed)
Admit for cerclage Procedure cerclage performed without complication Discharge dx Incompetent cervix s/p cerclage Uncomplicated D/C instructions reviewed F/u in office as scheduled

## 2020-03-23 LAB — OB RESULTS CONSOLE HIV ANTIBODY (ROUTINE TESTING): HIV: NONREACTIVE

## 2020-03-29 ENCOUNTER — Encounter (HOSPITAL_COMMUNITY): Payer: Self-pay | Admitting: Emergency Medicine

## 2020-03-29 ENCOUNTER — Ambulatory Visit (HOSPITAL_COMMUNITY)
Admission: EM | Admit: 2020-03-29 | Discharge: 2020-03-29 | Disposition: A | Discharge status: Home / Self Care | Payer: No Typology Code available for payment source | Attending: Nurse Practitioner | Admitting: Nurse Practitioner

## 2020-03-29 ENCOUNTER — Emergency Department (HOSPITAL_COMMUNITY)
Admission: EM | Admit: 2020-03-29 | Discharge: 2020-03-29 | Disposition: A | Discharge status: Home / Self Care | Payer: Medicaid Other | Attending: Emergency Medicine | Admitting: Emergency Medicine

## 2020-03-29 ENCOUNTER — Other Ambulatory Visit: Payer: Self-pay

## 2020-03-29 ENCOUNTER — Encounter (HOSPITAL_COMMUNITY): Payer: Self-pay

## 2020-03-29 DIAGNOSIS — O99343 Other mental disorders complicating pregnancy, third trimester: Secondary | ICD-10-CM | POA: Diagnosis present

## 2020-03-29 DIAGNOSIS — R45851 Suicidal ideations: Secondary | ICD-10-CM

## 2020-03-29 DIAGNOSIS — F4323 Adjustment disorder with mixed anxiety and depressed mood: Secondary | ICD-10-CM

## 2020-03-29 DIAGNOSIS — Z20822 Contact with and (suspected) exposure to covid-19: Secondary | ICD-10-CM | POA: Diagnosis not present

## 2020-03-29 DIAGNOSIS — J45909 Unspecified asthma, uncomplicated: Secondary | ICD-10-CM | POA: Insufficient documentation

## 2020-03-29 DIAGNOSIS — Z3A26 26 weeks gestation of pregnancy: Secondary | ICD-10-CM | POA: Diagnosis not present

## 2020-03-29 DIAGNOSIS — Y909 Presence of alcohol in blood, level not specified: Secondary | ICD-10-CM | POA: Insufficient documentation

## 2020-03-29 DIAGNOSIS — F410 Panic disorder [episodic paroxysmal anxiety] without agoraphobia: Secondary | ICD-10-CM

## 2020-03-29 LAB — COMPREHENSIVE METABOLIC PANEL
ALT: 18 U/L (ref 0–44)
AST: 25 U/L (ref 15–41)
Albumin: 3.8 g/dL (ref 3.5–5.0)
Alkaline Phosphatase: 50 U/L (ref 38–126)
Anion gap: 11 (ref 5–15)
BUN: 6 mg/dL (ref 6–20)
CO2: 18 mmol/L — ABNORMAL LOW (ref 22–32)
Calcium: 8.8 mg/dL — ABNORMAL LOW (ref 8.9–10.3)
Chloride: 107 mmol/L (ref 98–111)
Creatinine, Ser: 0.5 mg/dL (ref 0.44–1.00)
GFR, Estimated: >60 mL/min (ref >60)
Glucose, Bld: 84 mg/dL (ref 70–99)
Potassium: 3.3 mmol/L — ABNORMAL LOW (ref 3.5–5.1)
Sodium: 136 mmol/L (ref 135–145)
Total Bilirubin: 1.1 mg/dL (ref 0.3–1.2)
Total Protein: 6.8 g/dL (ref 6.5–8.1)

## 2020-03-29 LAB — CBC
HCT: 38.1 % (ref 36.0–46.0)
Hemoglobin: 13.1 g/dL (ref 12.0–15.0)
MCH: 33.6 pg (ref 26.0–34.0)
MCHC: 34.4 g/dL (ref 30.0–36.0)
MCV: 97.7 fL (ref 80.0–100.0)
Platelets: 190 10*3/uL (ref 150–400)
RBC: 3.9 MIL/uL (ref 3.87–5.11)
RDW: 13.2 % (ref 11.5–15.5)
WBC: 7.8 10*3/uL (ref 4.0–10.5)
nRBC: 0 % (ref 0.0–0.2)

## 2020-03-29 LAB — RAPID URINE DRUG SCREEN, HOSP PERFORMED
Amphetamines: NOT DETECTED
Barbiturates: NOT DETECTED
Benzodiazepines: NOT DETECTED
Cocaine: NOT DETECTED
Opiates: NOT DETECTED
Tetrahydrocannabinol: POSITIVE — AB

## 2020-03-29 LAB — RESP PANEL BY RT-PCR (FLU A&B, COVID) ARPGX2
Influenza A by PCR: NEGATIVE
Influenza B by PCR: NEGATIVE
SARS Coronavirus 2 by RT PCR: NEGATIVE

## 2020-03-29 LAB — SALICYLATE LEVEL: Salicylate Lvl: <7 mg/dL — ABNORMAL LOW (ref 7.0–30.0)

## 2020-03-29 LAB — ACETAMINOPHEN LEVEL: Acetaminophen (Tylenol), Serum: <10 ug/mL — ABNORMAL LOW (ref 10–30)

## 2020-03-29 LAB — ETHANOL: Alcohol, Ethyl (B): <10 mg/dL (ref <10)

## 2020-03-29 MED ORDER — ACETAMINOPHEN 325 MG PO TABS: 650.0000 mg | ORAL_TABLET | ORAL | Status: DC | PRN

## 2020-03-29 MED ORDER — ACETAMINOPHEN 325 MG PO TABS: 650.0000 mg | ORAL_TABLET | Freq: Four times a day (QID) | ORAL | Status: DC | PRN

## 2020-03-29 MED ORDER — PRENATAL MULTIVITAMIN CH: 1.0000 | ORAL_TABLET | Freq: Every day | ORAL | Status: DC
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
## 2020-03-29 NOTE — ED Provider Notes (Signed)
Behavioral Health Urgent Care Medical Screening Exam  Patient Name: Anna Vance MRN: [id number] Date of Evaluation: 03/29/20 Chief Complaint:   Diagnosis:  Final diagnoses:  Adjustment disorder with mixed anxiety and depressed mood    History of Present illness: Anna Vance is a 19 y.o. female who presented to [facility] due to worsening anxiety, depression, and SI. While in the ED, the patient initially reported SI and made statements wishing she was no longer pregnant. She refused collateral information and development of safety plan. Patient was transferred to [facility] for continuous assessment due to safety concerns for patient and her unborn child.   On arrival to [facility], patient states that she would like to go home. She states that ED staff did not inform her where she was being transferred to. She states that she is not suicidal. She states that she would never attempt suicide or harm her unborn child. She states that she has an appointment at [facility] in [place] on 12/24/201 at 8 am. Provider and TTS were able to confirm that [facility] is open on 03/30/20. Patient provided consent to contact her boyfriend, Selinda Michaels [phone]), to gather additional information. Per boyfriend, he does not have any safety concerns with the patient hurting herself, others, or her unborn baby He states that he is able to pick the patient up tonight.   On evaluation patient is alert and oriented x 4, pleasant, and cooperative. Speech is clear and coherent. Mood is depressed and affect is congruent with mood. Thought process is coherent and thought content is logical. Denies auditory and visual hallucinations. No indication that patient is responding to internal stimuli. No evidence of delusional thought content. Denies suicidal ideations. Denies homicidal ideations. Denies substance abuse.   Psychiatric Specialty Exam  Presentation  General Appearance:Appropriate for  Environment; Neat  Eye Contact:Good  Speech:Clear and Coherent; Normal Rate  Speech Volume:Normal  Handedness:No data recorded  Mood and Affect  Mood:Anxious  Affect:Congruent   Thought Process  Thought Processes:Coherent; Goal Directed; Linear  Descriptions of Associations:Intact  Orientation:Full (Time, Place and Person)  Thought Content:WDL  Hallucinations:None  Ideas of Reference:None  Suicidal Thoughts:No  Homicidal Thoughts:No   Sensorium  Memory:Immediate Good; Recent Good; Remote Good  Judgment:Fair  Insight:Fair   Executive Functions  Concentration:Good  Attention Span:Good  Recall:Good  Fund of Knowledge:Good  Language:Good   Psychomotor Activity  Psychomotor Activity:Normal   Assets  Assets:Communication Skills; Desire for Improvement; Financial Resources/Insurance; Housing; Physical Health; Transportation; Social Support   Sleep  Sleep:Fair  Number of hours: No data recorded  Physical Exam: Physical Exam Constitutional:      General: She is not in acute distress.    Appearance: She is not ill-appearing, toxic-appearing or diaphoretic.  HENT:     Head: Normocephalic.     Right Ear: External ear normal.     Left Ear: External ear normal.  Eyes:     Conjunctiva/sclera: Conjunctivae normal.     Pupils: Pupils are equal, round, and reactive to light.  Cardiovascular:     Rate and Rhythm: Normal rate.  Pulmonary:     Effort: Pulmonary effort is normal. No respiratory distress.  Musculoskeletal:        General: Normal range of motion.  Skin:    General: Skin is warm and dry.  Neurological:     Mental Status: She is alert and oriented to person, place, and time.  Psychiatric:        Mood and Affect: Mood is anxious and  depressed.        Thought Content: Thought content is not paranoid or delusional. Thought content does not include homicidal or suicidal ideation.    Review of Systems  Constitutional: Negative for  chills, diaphoresis, fever, malaise/fatigue and weight loss.  HENT: Negative for congestion.   Respiratory: Negative for cough and shortness of breath.   Cardiovascular: Negative for chest pain and palpitations.  Gastrointestinal: Negative for diarrhea, nausea and vomiting.  Neurological: Negative for dizziness and seizures.  Psychiatric/Behavioral: Positive for depression and suicidal ideas. Negative for hallucinations, memory loss and substance abuse. The patient is nervous/anxious and has insomnia.   All other systems reviewed and are negative.  Pulse 78, temperature 97.8 F (36.6 C), temperature source Tympanic, resp. rate 18, last menstrual period 09/25/2019, SpO2 100 %. There is no height or weight on file to calculate BMI.  Musculoskeletal: Strength & Muscle Tone: within normal limits Gait & Station: normal Patient leans: N/A  Demographic Factors:  Adolescent or young adult  Loss Factors: NA  Historical Factors: Family history of mental illness or substance abuse  Risk Reduction Factors:   Pregnancy, Responsible for children under 63 years of age, Sense of responsibility to family, Religious beliefs about death, Living with another person, especially a relative and Positive social support  Continued Clinical Symptoms:  anxiety  Cognitive Features That Contribute To Risk:  None    Suicide Risk:  Mild:  Suicidal ideation of limited frequency, intensity, duration, and specificity.  There are no identifiable plans, no associated intent, mild dysphoria and related symptoms, good self-control (both objective and subjective assessment), few other risk factors, and identifiable protective factors, including available and accessible social support.   [facility] M[facility] Discharge Disposition for Follow up and Recommendations: Based on my evaluation the patient does not appear to have an emergency medical condition and can be discharged with resources and follow up care in outpatient  services for Medication Management and Individual Therapy  Encouraged to keep appointment at [facility] for 03/30/2020.   Jackelyn Poling, NP 03/29/2020, 11:14 PM

## 2020-03-29 NOTE — BHH Counselor (Signed)
Clinician attempted to call pt's nurse to set use TTS cart however the call went to voicemail. Clinician also spoke to Lauren, NS/NT to transfer clinician to pt's nurse however the call went to voicemail. Clinician called, NS/NT to express no one answered. NS/NT expressed she will go to triage and inform staff TTS is ready to complete the pt's assessment. Clinician asked NS/NT to let staff know she will call the TTS cart in 10 minutes.     Redmond Pulling, MS, [facility], [facility] Triage Specialist [phone]

## 2020-03-29 NOTE — ED Triage Notes (Addendum)
Pt presents with suicidal ideation, no plan noted. Pt is [redacted] weeks pregnant.  Pt is adamant that she does not want to be at this facility. Pt wants to be discharged  home.  Denies SI at present.  Denies HI or AVH.

## 2020-03-29 NOTE — ED Notes (Signed)
Report called to [facility]. Safe transport called.

## 2020-03-29 NOTE — BH Assessment (Signed)
Pt was transported to [facility] from [facility] for Continuous Observation. Pt continued to deny SI and wants to go home. Pt consented for clinician to contact her boyfriend Anna Vance [place], [phone]) to gather additional information. Per boyfriend, he does not has any safety concerns with the pt hurting herself, others or her unborn baby; and will pick her up from [facility]. Clinician discussed the updated disposition with the pt. Clinician gave pt's boyfriend the address to [facility]. Pt reported, she has an appointment tomorrow at 8am at [facility] in [place], [place].    Redmond Pulling, MS, [facility], [facility] Triage Specialist [phone]

## 2020-03-29 NOTE — ED Notes (Signed)
As per Nira Conn, NP, pt to be discharged home, when pt's boyfriend arrives.

## 2020-03-29 NOTE — ED Triage Notes (Addendum)
Patient states she has been having suicidal thoughts x 1 month. Patient denies having a plan. Patient denies alcohol, drug use, visual or auditory hallucinations.  Patient stated during triage- "I had a panic attack and destroyed stuff in my house."

## 2020-03-29 NOTE — Discharge Instructions (Signed)
Keep appointment with psychiatric provider on 03/30/2020.   Discharge recommendations:  Patient is to take medications as prescribed. Please see information for follow-up appointment with psychiatry and therapy. Please follow up with your primary care provider for all medical related needs.   Therapy: We recommend that patient participate in individual therapy to address mental health concerns.     Safety:  The patient should abstain from use of illicit substances/drugs and abuse of any medications. If symptoms worsen or do not continue to improve or if the patient becomes actively suicidal or homicidal then it is recommended that the patient return to the closest hospital emergency department, the [facility], or call 911 for further evaluation and treatment. National Suicide Prevention Lifeline 1-800-SUICIDE or 408-530-8098.
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
## 2020-04-07 NOTE — L&D Delivery Note (Signed)
Delivery Note Labor onset: 06/25/2020  Labor Onset Time: 1652 Complete dilation at 12:09 AM  Onset of pushing at 0015 FHR second stage Cat 1 Analgesia/Anesthesia intrapartum: Epidural  Guided pushing with maternal urge. Delivery of a viable female at 0038. Fetal head delivered in LOA position.  Nuchal cord: none.  Infant placed on maternal abd, dried, and tactile stim.  Cord double clamped after 4 min and cut by Father.  Father present for birth.  Cord blood sample collected: Yes Arterial cord blood sample collected: No  Placenta delivered Schultz, intact, with 3 VC.  Placenta to L&D. Uterine tone firm, bleeding brisk, no clots  Posterior cervical laceration identified.  Anesthesia: Epidural Repaired w/ 2-0 Monocryl CT in a running lock fashion by Dr. Cole  QBL/EBL (mL): 525 Complications: cervical laceration APGAR: APGAR (1 MIN): 9   APGAR (5 MINS): 9   APGAR (10 MINS):   Mom to postpartum.  Baby to Couplet care / Skin to Skin.  Kahli Fitzgerald B [place] Eppard MSN, CNM 06/08/2020, 1:50 AM   

## 2020-04-30 ENCOUNTER — Other Ambulatory Visit: Payer: Self-pay

## 2020-04-30 ENCOUNTER — Encounter (HOSPITAL_COMMUNITY): Payer: Self-pay | Admitting: Obstetrics & Gynecology

## 2020-04-30 ENCOUNTER — Inpatient Hospital Stay (HOSPITAL_BASED_OUTPATIENT_CLINIC_OR_DEPARTMENT_OTHER): Payer: Medicaid Other

## 2020-04-30 ENCOUNTER — Observation Stay (HOSPITAL_COMMUNITY)
Admission: AD | Admit: 2020-04-30 | Discharge: 2020-05-01 | DRG: 831 | Disposition: A | Discharge status: Home / Self Care | Payer: Medicaid Other | Attending: Obstetrics and Gynecology | Admitting: Obstetrics and Gynecology

## 2020-04-30 DIAGNOSIS — B951 Streptococcus, group B, as the cause of diseases classified elsewhere: Secondary | ICD-10-CM | POA: Diagnosis not present

## 2020-04-30 DIAGNOSIS — O26873 Cervical shortening, third trimester: Secondary | ICD-10-CM | POA: Diagnosis not present

## 2020-04-30 DIAGNOSIS — Z20822 Contact with and (suspected) exposure to covid-19: Secondary | ICD-10-CM | POA: Diagnosis present

## 2020-04-30 DIAGNOSIS — O4693 Antepartum hemorrhage, unspecified, third trimester: Secondary | ICD-10-CM

## 2020-04-30 DIAGNOSIS — Z91010 Allergy to peanuts: Secondary | ICD-10-CM | POA: Diagnosis not present

## 2020-04-30 DIAGNOSIS — O403XX0 Polyhydramnios, third trimester, not applicable or unspecified: Secondary | ICD-10-CM | POA: Diagnosis present

## 2020-04-30 DIAGNOSIS — J45909 Unspecified asthma, uncomplicated: Secondary | ICD-10-CM | POA: Diagnosis not present

## 2020-04-30 DIAGNOSIS — E559 Vitamin D deficiency, unspecified: Secondary | ICD-10-CM | POA: Diagnosis not present

## 2020-04-30 DIAGNOSIS — Z3A31 31 weeks gestation of pregnancy: Secondary | ICD-10-CM | POA: Diagnosis not present

## 2020-04-30 DIAGNOSIS — O09893 Supervision of other high risk pregnancies, third trimester: Secondary | ICD-10-CM

## 2020-04-30 DIAGNOSIS — Z880 Allergy status to penicillin: Secondary | ICD-10-CM

## 2020-04-30 DIAGNOSIS — O4703 False labor before 37 completed weeks of gestation, third trimester: Principal | ICD-10-CM | POA: Diagnosis present

## 2020-04-30 DIAGNOSIS — O409XX0 Polyhydramnios, unspecified trimester, not applicable or unspecified: Secondary | ICD-10-CM | POA: Diagnosis not present

## 2020-04-30 DIAGNOSIS — O99820 Streptococcus B carrier state complicating pregnancy: Secondary | ICD-10-CM | POA: Diagnosis present

## 2020-04-30 DIAGNOSIS — O3433 Maternal care for cervical incompetence, third trimester: Secondary | ICD-10-CM

## 2020-04-30 DIAGNOSIS — Z8659 Personal history of other mental and behavioral disorders: Secondary | ICD-10-CM

## 2020-04-30 DIAGNOSIS — F4310 Post-traumatic stress disorder, unspecified: Secondary | ICD-10-CM | POA: Diagnosis not present

## 2020-04-30 DIAGNOSIS — O479 False labor, unspecified: Secondary | ICD-10-CM | POA: Diagnosis present

## 2020-04-30 DIAGNOSIS — O3432 Maternal care for cervical incompetence, second trimester: Secondary | ICD-10-CM

## 2020-04-30 LAB — WET PREP, GENITAL
Clue Cells Wet Prep HPF POC: NONE SEEN
Trich, Wet Prep: NONE SEEN
Yeast Wet Prep HPF POC: NONE SEEN

## 2020-04-30 LAB — URINALYSIS, ROUTINE W REFLEX MICROSCOPIC
Bilirubin Urine: NEGATIVE
Glucose, UA: NEGATIVE mg/dL
Ketones, ur: NEGATIVE mg/dL
Nitrite: NEGATIVE
Protein, ur: NEGATIVE mg/dL
Specific Gravity, Urine: 1.015 (ref 1.005–1.030)
pH: 6 (ref 5.0–8.0)

## 2020-04-30 LAB — TYPE AND SCREEN
ABO/RH(D): O POS
Antibody Screen: NEGATIVE

## 2020-04-30 LAB — OB RESULTS CONSOLE GBS: GBS: POSITIVE

## 2020-04-30 MED ORDER — LACTATED RINGERS IV BOLUS
1000.0000 mL | Freq: Once | INTRAVENOUS | Status: AC
  Administered 2020-04-30: 1000 mL via INTRAVENOUS

## 2020-04-30 MED ORDER — ACETAMINOPHEN 325 MG PO TABS: 650.0000 mg | ORAL_TABLET | ORAL | Status: DC | PRN

## 2020-04-30 MED ORDER — TERBUTALINE SULFATE 1 MG/ML IJ SOLN
0.2500 mg | Freq: Once | INTRAMUSCULAR | Status: AC
  Administered 2020-04-30: 0.25 mg via SUBCUTANEOUS
  Filled 2020-04-30: qty 1

## 2020-04-30 MED ORDER — CALCIUM CARBONATE ANTACID 500 MG PO CHEW: 2.0000 | CHEWABLE_TABLET | ORAL | Status: DC | PRN

## 2020-04-30 MED ORDER — DOCUSATE SODIUM 100 MG PO CAPS
100.0000 mg | ORAL_CAPSULE | Freq: Every day | ORAL | Status: DC
  Administered 2020-05-01: 100 mg via ORAL
  Filled 2020-04-30: qty 1

## 2020-04-30 MED ORDER — ZOLPIDEM TARTRATE 5 MG PO TABS: 5.0000 mg | ORAL_TABLET | Freq: Every evening | ORAL | Status: DC | PRN

## 2020-04-30 MED ORDER — VITAMIN D 25 MCG (1000 UNIT) PO TABS
4000.0000 [IU] | ORAL_TABLET | Freq: Every day | ORAL | Status: DC
  Administered 2020-05-01: 4000 [IU] via ORAL
  Filled 2020-04-30 (×2): qty 4

## 2020-04-30 MED ORDER — PRENATAL MULTIVITAMIN CH
1.0000 | ORAL_TABLET | Freq: Every day | ORAL | Status: DC
  Administered 2020-05-01: 1 via ORAL
  Filled 2020-04-30: qty 1

## 2020-04-30 NOTE — MAU Provider Note (Signed)
History     CSN: [id number]  Arrival date and time: 04/30/20 1759   Event Date/Time   First Provider Initiated Contact with Patient 04/30/20 1937      Chief Complaint  Patient presents with  . Vaginal Bleeding   HPI  [name] is a 20 y.o. female G2P1 @ [redacted]w[redacted]d here in MAU with complaints of vaginal bleeding that started around 5:00 pm. She has a known shortened cervix with cerclage in place. She has a history of a term vaginal delivery, this is a new partner.  She has had no previous episodes of bleeding. She denies recent intercourse, + masturbation 2 days ago with no bleeding following. She has been on pelvic rest otherwise.  Has not taken progesterone in this pregnancy.   OB History    Gravida  2   Para  1   Term      Preterm      AB      Living  1     SAB      IAB      Ectopic      Multiple      Live Births              Past Medical History:  Diagnosis Date  . Asthma   . Vertigo     Past Surgical History:  Procedure Laterality Date  . CERVICAL CERCLAGE N/A 02/24/2020   Procedure: CERCLAGE CERVICAL;  Surgeon: Osborn Coho, MD;  Location: [facility];  Service: Gynecology;  Laterality: N/A;  . NO PAST SURGERIES      Family History  Problem Relation Age of Onset  . Renal Disease Mother   . Bipolar disorder Mother   . Hypertension Father   . Diabetes Father   . High Cholesterol Father     Social History   Tobacco Use  . Smoking status: Never Smoker  . Smokeless tobacco: Never Used  Vaping Use  . Vaping Use: Never used  Substance Use Topics  . Alcohol use: Never  . Drug use: Never    Allergies:  Allergies  Allergen Reactions  . Chocolate Hives  . Other Hives    Peanuts and strawberries  . Penicillins     Did it involve swelling of the face/tongue/throat, SOB, or low BP? Unknown Did it involve sudden or severe rash/hives, skin peeling, or any reaction on the inside of your mouth or nose? Unknown Did you  need to seek medical attention at a hospital or doctor's office? Unknown When did it last happen?child If all above answers are "NO", may proceed with cephalosporin use.      Medications Prior to Admission  Medication Sig Dispense Refill Last Dose  . cholecalciferol (VITAMIN D3) 25 MCG (1000 UNIT) tablet Take 1,000 Units by mouth daily.   04/30/2020 at Unknown time  . Prenatal Vit-Fe Fumarate-FA (MULTIVITAMIN-PRENATAL) 27-0.8 MG TABS tablet Take 1 tablet by mouth daily at 12 noon.   04/30/2020 at Unknown time  . albuterol (VENTOLIN HFA) 108 (90 Base) MCG/ACT inhaler Inhale 2 puffs into the lungs every 6 (six) hours as needed for wheezing or shortness of breath.   More than a month at Unknown time  . EPINEPHrine 0.3 mg/0.3 mL IJ SOAJ injection Inject 0.3 mg into the muscle as needed for anaphylaxis.    Unknown at Unknown time  . ondansetron (ZOFRAN ODT) 4 MG disintegrating tablet Take 1-2 tablets (4-8 mg total) by mouth every 8 (eight) hours as needed for nausea  or vomiting. (Patient not taking: No sig reported) 20 tablet 0 More than a month at Unknown time   Results for orders placed or performed during the hospital encounter of 04/30/20 (from the past 48 hour(s))  Urinalysis, Routine w reflex microscopic Urine, Clean Catch     Status: Abnormal   Collection Time: 04/30/20  6:37 PM  Result Value Ref Range   Color, Urine YELLOW YELLOW   APPearance HAZY (A) CLEAR   Specific Gravity, Urine 1.015 1.005 - 1.030   pH 6.0 5.0 - 8.0   Glucose, UA NEGATIVE NEGATIVE mg/dL   Hgb urine dipstick MODERATE (A) NEGATIVE   Bilirubin Urine NEGATIVE NEGATIVE   Ketones, ur NEGATIVE NEGATIVE mg/dL   Protein, ur NEGATIVE NEGATIVE mg/dL   Nitrite NEGATIVE NEGATIVE   Leukocytes,Ua MODERATE (A) NEGATIVE   RBC / HPF 0-5 0 - 5 RBC/hpf   WBC, UA 11-20 0 - 5 WBC/hpf   Bacteria, UA RARE (A) NONE SEEN   Squamous Epithelial / LPF 0-5 0 - 5   Mucus PRESENT    Sperm, UA PRESENT     Comment: Performed at [facility], [address], [place], [place] [address]  Wet prep, genital     Status: Abnormal   Collection Time: 04/30/20  7:41 PM   Specimen: Vaginal  Result Value Ref Range   Yeast Wet Prep HPF POC NONE SEEN NONE SEEN   Trich, Wet Prep NONE SEEN NONE SEEN   Clue Cells Wet Prep HPF POC NONE SEEN NONE SEEN   WBC, Wet Prep HPF POC MANY (A) NONE SEEN   Sperm PRESENT     Comment: Performed at [facility], [address] [place], [place] [address]   Review of Systems  Gastrointestinal: Negative for abdominal pain.  Genitourinary: Positive for vaginal bleeding. Negative for vaginal discharge.   Physical Exam   Blood pressure 111/64, pulse 85, temperature 98.6 F (37 C), resp. rate 16, height 5\' 2"  (1.575 m), weight 68 kg, last menstrual period 09/25/2019, SpO2 100 %.  Physical Exam Constitutional:      General: She is not in acute distress.    Appearance: Normal appearance. She is obese. She is not ill-appearing, toxic-appearing or diaphoretic.  HENT:     Head: Normocephalic.  Genitourinary:    Comments: Vagina - Small pink/white discharge, 1/2 fox saturated.  Cervix - no active bleeding  Bimanual exam: Gentle Cervix exam: closed, cerclage palpated  GC/Chlam, wet prep done Chaperone present for exam.  Skin:    General: Skin is warm.  Neurological:     Mental Status: She is alert and oriented to person, place, and time.  Psychiatric:        Behavior: Behavior normal.    Fetal Tracing: Baseline: 140 bpm Variability: Moderate  Accelerations: 10x10 & 15x15 Decelerations: None Toco: UI  MAU Course  Procedures  None   MDM  Discussed patient with Dr. 09/27/2019; recommend admission for vaginal bleeding. UA shows sperm  Su Hilt CNM to come to MAU to admit patient.   Assessment and Plan   A:  1. Incompetent cervix during second trimester, antepartum   2. Vaginal bleeding in pregnancy, third trimester   3. [redacted] weeks gestation of pregnancy     P:  Admit  to [facility] speciality Cat 1 fetal tracing.   [facility], NP 04/30/2020 8:10 PM

## 2020-04-30 NOTE — H&P (Signed)
OB ADMISSION/ HISTORY & PHYSICAL:  Admission Date: 04/30/2020  5:59 PM  Admit Diagnosis: Vaginal bleeding  Anna Vance is a 20 y.o. female G2P1001 [redacted]w[redacted]d presenting for vaginal bleeding. Endorses active FM and denies LOF. Known shortened cervix w/ cerclage placed @ 21+5 wks. Pt was to start Prometrium @ 18 wks, but has peanut allergy and was advised against Prometrium. MFM rec compound progesterone vaginal suppositories. Suppositories were cost prohibitive so pt has used any form of progesterone therapy this pregnancy.  Hx of bipolar disorder, PTSD, and major depressive disorder, previously on Abilify, currently takes no meds, seeing a therapist at [facility] in [place] and reports stable mood. Denies SI/HI at present. Last hospitalized in 2020 for bipolar/aggression, cannabis abuse.   History of current pregnancy: G2P1001   Patient entered care with [facility] at 11+2 wks.   EDC 07/01/20 by [place] @ 6+2 wks   Anatomy scan:  19+3 wks, complete w/ anterior placenta.    Last evaluation: 28.4 wks 2.9 cm cervical length w/funneling, vertex, 2# 11 oz (28%), lagging FL 9%, AFI 27.3   Significant prenatal events:  Patient Active Problem List   Diagnosis Date Noted  . Vaginal bleeding in pregnancy, third trimester 04/30/2020  . PTSD (post-traumatic stress disorder) 04/30/2020  . Hx of bipolar disorder 04/30/2020  . Hx of major depression 04/30/2020  . Group beta Strep positive 04/30/2020  . Vitamin D deficiency 04/30/2020  . Asthma 04/30/2020  . Incompetent cervix during second trimester, antepartum 02/24/2020    Prenatal Labs: ABO, Rh: --/--/O POS (01/24 2052) Antibody: NEG (01/24 2052) Rubella:   immune RPR:   NR HBsAg:   NR HIV:   NR GTT: pssed 1 hr GBS:   GBS bacteriuria on NOB labs, culture w/ sensitivities today GC/CHL: neg/neg Genetics: low-risk female, Neg Horizon Tdap/influenza vaccines: declines both   OB History  Gravida Para Term Preterm AB Living  2 1 1     1   SAB  IAB Ectopic Multiple Live Births          1    # Outcome Date GA Lbr Len/2nd Weight Sex Delivery Anes PTL Lv  2 Current           1 Term      Vag-Spont   LIV    Medical / Surgical History: Past medical history:  Past Medical History:  Diagnosis Date  . Asthma   . Vertigo     Past surgical history:  Past Surgical History:  Procedure Laterality Date  . CERVICAL CERCLAGE N/A 02/24/2020   Procedure: CERCLAGE CERVICAL;  Surgeon: 02/26/2020, MD;  Location: [facility] LD [facility]S;  Service: Gynecology;  Laterality: N/A;  . NO PAST SURGERIES     Family History:  Family History  Problem Relation Age of Onset  . Renal Disease Mother   . Bipolar disorder Mother   . Hypertension Father   . Diabetes Father   . High Cholesterol Father     Social History:  reports that she has never smoked. She has never used smokeless tobacco. She reports that she does not drink alcohol and does not use drugs.  Allergies: Chocolate, Other, and Penicillins   Current Medications at time of admission:  Prior to Admission medications   Medication Sig Start Date End Date Taking? Authorizing Provider  cholecalciferol (VITAMIN D3) 25 MCG (1000 UNIT) tablet Take 1,000 Units by mouth daily.   Yes [provider]  Prenatal Vit-Fe Fumarate-FA (MULTIVITAMIN-PRENATAL) 27-0.8 MG TABS tablet Take 1 tablet  by mouth daily at 12 noon.   Yes [provider]  albuterol (VENTOLIN HFA) 108 (90 Base) MCG/ACT inhaler Inhale 2 puffs into the lungs every 6 (six) hours as needed for wheezing or shortness of breath.    [provider]  EPINEPHrine 0.3 mg/0.3 mL IJ SOAJ injection Inject 0.3 mg into the muscle as needed for anaphylaxis.     [provider]  ondansetron (ZOFRAN ODT) 4 MG disintegrating tablet Take 1-2 tablets (4-8 mg total) by mouth every 8 (eight) hours as needed for nausea or vomiting. Patient not taking: No sig reported 12/31/19   Gerrit Heck, CNM    Review of  Systems: Constitutional: Negative   HENT: Negative   Eyes: Negative   Respiratory: Negative   Cardiovascular: Negative   Gastrointestinal: Negative  Genitourinary: pos for vaginal bleeding, neg for LOF   Musculoskeletal: Negative   Skin: Negative   Neurological: Negative   Endo/Heme/Allergies: Negative   Psychiatric/Behavioral: Negative    Physical Exam: VS: Blood pressure 111/64, pulse 85, temperature 98.6 F (37 C), resp. rate 16, height 5\' 2"  (1.575 m), weight 68 kg, last menstrual period 09/25/2019, SpO2 100 %. AAO x3, no signs of distress Cardiovascular: RRR Respiratory: Lung fields clear to ausculation GU/GI: Abdomen gravid, non-tender, non-distended, active FM Extremities: no edema, negative for pain, tenderness, and cords  Cervical exam:Dilation: Fingertip (dimple cerclage in place) Exam by:: J.Rasch,NP FHR: baseline rate 140 / variability moderate / accelerations present / absent decelerations TOCO: 2-4   Prenatal Transfer Tool  Maternal Diabetes: No Genetic Screening: Normal Maternal Ultrasounds/Referrals: Normal Fetal Ultrasounds or other Referrals:  None Maternal Substance Abuse:  Yes:  Type: Marijuana Significant Maternal Medications:  None Significant Maternal Lab Results: Group B Strep positive    Assessment: 20 y.o. G2P1001 [redacted]w[redacted]d  Vaginal bleeding in third trimester Shortened cervix w/ cerclage in place PCN allergy FHR category 1 GBS pos Vitamin D deficient Hx of PTSD, bipolar disorder, and major depressive disorder    -no meds, stable   Plan:  Admit to [facility] Routine admission orders U/S w/ cervical length MFM consult in AM Urine culture GBS culture w/ sensitivities Wet prep GC/Chlamydia  Plan of care   [facility] MSN, CNM 04/30/2020 8:27 PM

## 2020-04-30 NOTE — MAU Note (Signed)
Pt states a few hours ago she noticed some blood on tissue she she wiped. States she has a cerclage. Denies pain.

## 2020-05-01 ENCOUNTER — Encounter: Payer: Self-pay | Admitting: Family Medicine

## 2020-05-01 DIAGNOSIS — O409XX0 Polyhydramnios, unspecified trimester, not applicable or unspecified: Secondary | ICD-10-CM | POA: Diagnosis not present

## 2020-05-01 DIAGNOSIS — O479 False labor, unspecified: Secondary | ICD-10-CM | POA: Diagnosis present

## 2020-05-01 LAB — GC/CHLAMYDIA PROBE AMP (~~LOC~~) NOT AT ARMC
Chlamydia: NEGATIVE
Comment: NEGATIVE
Comment: NORMAL
Neisseria Gonorrhea: NEGATIVE

## 2020-05-01 LAB — SARS CORONAVIRUS 2 (TAT 6-24 HRS): SARS Coronavirus 2: NEGATIVE

## 2020-05-01 MED ORDER — LACTATED RINGERS IV BOLUS
1000.0000 mL | Freq: Once | INTRAVENOUS | Status: AC
  Administered 2020-05-01: 1000 mL via INTRAVENOUS

## 2020-05-01 MED ORDER — FENTANYL CITRATE (PF) 100 MCG/2ML IJ SOLN
100.0000 ug | Freq: Once | INTRAMUSCULAR | Status: AC
  Administered 2020-05-01: 100 ug via INTRAVENOUS
  Filled 2020-05-01: qty 2

## 2020-05-01 MED ORDER — VITAMIN D3 25 MCG PO TABS: 4000.0000 [IU] | ORAL_TABLET | Freq: Every day | ORAL | 0 refills | Status: DC

## 2020-05-01 MED ORDER — CYCLOBENZAPRINE HCL 10 MG PO TABS
10.0000 mg | ORAL_TABLET | Freq: Three times a day (TID) | ORAL | Status: DC | PRN
  Administered 2020-05-01: 10 mg via ORAL

## 2020-05-01 MED ORDER — CYCLOBENZAPRINE HCL 10 MG PO TABS
10.0000 mg | ORAL_TABLET | Freq: Three times a day (TID) | ORAL | Status: DC
  Filled 2020-05-01: qty 1

## 2020-05-01 MED ORDER — LACTATED RINGERS IV SOLN: INTRAVENOUS | Status: DC

## 2020-05-01 NOTE — Progress Notes (Signed)
Teaching complete 

## 2020-05-01 NOTE — Discharge Summary (Cosign Needed Addendum)
ANtepartum OB Discharge Summary     Patient Name: Anna Vance DOB: 06-28-2000 MRN: [id number]  Date of admission: 04/30/2020 Delivering MD: This patient has no babies on file.   Admitting diagnosis: Vaginal bleeding in pregnancy, third trimester [O46.93] Intrauterine pregnancy: [redacted]w[redacted]d     Secondary diagnosis:  Active Problems:   Vaginal bleeding in pregnancy, third trimester   PTSD (post-traumatic stress disorder)   Hx of bipolar disorder   Hx of major depression   Vitamin D deficiency   Asthma   Braxton Hick's contraction   Polyhydramnios   History of Present Illness: Ms. Anna Vance is a 20 y.o. female, G2P1001, who presents at [redacted]w[redacted]d weeks gestation. The patient has been followed at  [facility]  Her pregnancy has been complicated by:  Patient Active Problem List   Diagnosis Date Noted  . Braxton Hick's contraction 05/01/2020  . Polyhydramnios 05/01/2020  . Vaginal bleeding in pregnancy, third trimester 04/30/2020  . PTSD (post-traumatic stress disorder) 04/30/2020  . Hx of bipolar disorder 04/30/2020  . Hx of major depression 04/30/2020  . Vitamin D deficiency 04/30/2020  . Asthma 04/30/2020  . Incompetent cervix during second trimester, antepartum 02/24/2020    Hospital course:    MAU HPI 1/24 @ 7:37PM [name] is a 20 y.o. female G2P1 @ [redacted]w[redacted]d here in MAU with complaints of vaginal bleeding that started around 5:00 pm. She has a known shortened cervix with cerclage in place. She has a history of a term vaginal delivery, this is a new partner.  She has had no previous episodes of bleeding. She denies recent intercourse, + masturbation 2 days ago with no bleeding following. She has been on pelvic rest otherwise.  Has not taken progesterone in this pregnancy.   Admission 1/24 @ 8:27PM Anna Vance is a 20 y.o. female G2P1001 [redacted]w[redacted]d presenting for vaginal bleeding.  Endorses active FM and denies LOF. Known shortened cervix w/ cerclage placed @ 21+5 wks. Pt was to start Prometrium @ 18 wks, but has peanut allergy and was advised against Prometrium. MFM rec compound progesterone vaginal suppositories. Suppositories were cost prohibitive so pt has used any form of progesterone therapy this pregnancy.  Hx of bipolar disorder, PTSD, and major depressive disorder, previously on Abilify, currently takes no meds, seeing a therapist at [facility] in [place] and reports stable mood. Denies SI/HI at present. Last hospitalized in 2020 for bipolar/aggression, cannabis abuse.   History of current pregnancy: G2P1001   Patient entered care with [facility] at 11+2 wks.  EDC3/27/22by [place] @ 6+2 wks  Anatomy scan:19+3 wks, complete w/ anteriorplacenta.   Last evaluation:28.4 wks 2.9 cm cervical length w/funneling, vertex, 2# 11 oz (28%), lagging FL 9%, AFI 27.3 [place] 1/25 showed, new dx of polyhydramnios AFI 30.1, np placenta previa noted, cervix 4.53cm cerclage noted in place, BPP 8/8, 3.11lbs, 34%, anterior placenta, cephalic presentation. Wet prep showed sperm present, but pt declined having intercourse. GBS Pending, GC/C and UC pending. USD+ for Cannabis.   Today 1/25, Pt to be discharged to home in stable condition. Pt denies vaginal bleeding or leakage, last seen brown and clear to pink last night prior to MN. Pt endorses feeling BHC, but not strong. +FM. Pt educated on preterm labor or SROM and instructed to return to MAU. Pt instructed on pelvis rest, and nothing in the vagina or rectum. Pt verbalized understanding. Smoking cessation encouraged. Consulted with Dr Mora Appl, verbalized ok to go home.  Fetal Tracing: Baseline: 150 bpm Variability: Moderate  Accelerations: 10x10 & 15x15 Decelerations: None Toco: UI  Physical exam  Vitals:   04/30/20 2353 05/01/20 0457 05/01/20 0955 05/01/20 1134  BP: (!) 116/59 106/62 119/72 112/67  Pulse: 77 91 74 78  Resp:  16 18 18    Temp:  98.5 F (36.9 C) 98.1 F (36.7 C) 98.2 F (36.8 C)  TempSrc:  Oral Oral Oral  SpO2:  99% 99% 99%  Weight:      Height:       General: alert, cooperative and no distress Lochia: None Uterine Fundus: soft and non tender and equal to dates,  Perineum: Intact, no vaginal blood noted DVT Evaluation: No evidence of DVT seen on physical exam. Negative Homan's sign. No cords or calf tenderness. No significant calf/ankle edema.  Labs: Lab Results  Component Value Date   WBC 7.8 03/29/2020   HGB 13.1 03/29/2020   HCT 38.1 03/29/2020   MCV 97.7 03/29/2020   PLT 190 03/29/2020   CMP Latest Ref Rng & Units 03/29/2020  Glucose 70 - 99 mg/dL 84  BUN 6 - 20 mg/dL 6  Creatinine 03/31/2020 - 5.46 mg/dL 2.70  Sodium 3.50 - 093 mmol/L 136  Potassium 3.5 - 5.1 mmol/L 3.3(L)  Chloride 98 - 111 mmol/L 107  CO2 22 - 32 mmol/L 18(L)  Calcium 8.9 - 10.3 mg/dL 818)  Total Protein 6.5 - 8.1 g/dL 6.8  Total Bilirubin 0.3 - 1.2 mg/dL 1.1  Alkaline Phos 38 - 126 U/L 50  AST 15 - 41 U/L 25  ALT 0 - 44 U/L 18    Date of discharge: 05/01/2020 Discharge Diagnoses: Vaginal bleeding in thirdf trimester with [facility]  Discharge instruction: per After Visit Summary and "Baby and Me Booklet".  After visit meds:   Activity:           pelvic rest Advance as tolerated. Pelvic rest for 6 weeks.  Diet:                routine Medications: PNV Condition:  Pt discharge to home in stable condition   PRETERM LABOR: Includes any of the following symptoms that occur between 20-[redacted] weeks gestation. If these symptoms are not stopped, preterm labor can result in preterm delivery, placing your baby at risk.  Notify your doctor if any of the following occur: 1. Menstrual-like cramps   5. Pelvic pressure  2. Uterine contractions. These may be painless and feel like the uterus is tightening or the baby is "balling up" 6. Increase or change in vaginal discharge  3. Low, dull backache, unrelieved by heat or Tylenol  7.  Vaginal bleeding  4. Intestinal cramps, with our without diarrhea, sometimes 8. A general feeling that "something is not right"   9. Leaking of fluid described as "gas pain"   LABOR: When contractions begin, you should start to time them from the beginning of one contraction to the beginning of the next.  When contractions are 5-10 minutes apart or less and have been regular for at least an hour, you should call your health care provider.  Notify your doctor if any of the following occur: 1. Bleeding from the vagina 7. Sudden, constant, or occasional abdominal pain  2. Pain or burning when urinating 8. Sudden gushing of fluid from the vagina (with or without continued leaking)  3. Chills or fever 9. Fainting spells, "black outs" or loss of consciousness  4. Increase in vaginal discharge 10. Severe or continued nausea or vomiting  5. Pelvic pressure (sudden increase) 11. Blurring of vision or spots before the eyes  6. Baby moving less than usual 12. Leaking of fluid    FETAL KICK COUNT: Lie on your left side for one hour after a meal, and count the number of times your baby kicks. If it is less than 5 times, get up, move around and drink some juice. Repeat the test 30 minutes later. If it is still less than 5 kicks in an hour, notify your doctor.  Meds: Allergies as of 05/01/2020      Reactions   Chocolate Hives   Other Hives   Peanuts and strawberries   Penicillins    Did it involve swelling of the face/tongue/throat, SOB, or low BP? Unknown Did it involve sudden or severe rash/hives, skin peeling, or any reaction on the inside of your mouth or nose? Unknown Did you need to seek medical attention at a hospital or doctor's office? Unknown When did it last happen?child If all above answers are "NO", may proceed with cephalosporin use.      Medication List    TAKE these medications   albuterol 108 (90 Base) MCG/ACT inhaler Commonly known as: VENTOLIN HFA Inhale 2 puffs into the  lungs every 6 (six) hours as needed for wheezing or shortness of breath.   EPINEPHrine 0.3 mg/0.3 mL Soaj injection Commonly known as: EPI-PEN Inject 0.3 mg into the muscle as needed for anaphylaxis.   multivitamin-prenatal 27-0.8 MG Tabs tablet Take 1 tablet by mouth daily at 12 noon.   ondansetron 4 MG disintegrating tablet Commonly known as: Zofran ODT Take 1-2 tablets (4-8 mg total) by mouth every 8 (eight) hours as needed for nausea or vomiting.   Vitamin D3 25 MCG tablet Commonly known as: Vitamin D Take 4 tablets (4,000 Units total) by mouth daily. Start taking on: May 02, 2020 What changed:   medication strength  how much to take       Discharge Follow Up:   Follow-up Information    [facility] Follow up.   Specialty: Obstetrics and Gynecology Why: 05/04/2019 ROB Contact information: [address] [place] [address] [phone]              Spent 30 mins face to face with >50% in education.   [place], NP-C, CNM 05/01/2020, 11:53 AM  Dale Chunky, FNP

## 2020-05-02 LAB — URINE CULTURE: Culture: 2000 — AB

## 2020-05-03 ENCOUNTER — Encounter: Payer: Self-pay | Admitting: Obstetrics and Gynecology

## 2020-05-03 DIAGNOSIS — R8271 Bacteriuria: Secondary | ICD-10-CM | POA: Insufficient documentation

## 2020-05-03 LAB — CULTURE, BETA STREP (GROUP B ONLY)

## 2020-06-20 ENCOUNTER — Other Ambulatory Visit: Payer: Self-pay | Admitting: Obstetrics & Gynecology

## 2020-06-21 ENCOUNTER — Telehealth (HOSPITAL_COMMUNITY): Payer: Self-pay | Admitting: *Deleted

## 2020-06-21 NOTE — Telephone Encounter (Signed)
Preadmission screen  

## 2020-06-22 ENCOUNTER — Telehealth (HOSPITAL_COMMUNITY): Payer: Self-pay | Admitting: *Deleted

## 2020-06-22 NOTE — Telephone Encounter (Signed)
Preadmission screen  

## 2020-06-23 ENCOUNTER — Other Ambulatory Visit (HOSPITAL_COMMUNITY)
Admission: RE | Admit: 2020-06-23 | Discharge: 2020-06-23 | Disposition: A | Discharge status: Home / Self Care | Payer: Medicaid Other | Source: Ambulatory Visit | Attending: Obstetrics & Gynecology | Admitting: Obstetrics & Gynecology

## 2020-06-23 DIAGNOSIS — Z20822 Contact with and (suspected) exposure to covid-19: Secondary | ICD-10-CM | POA: Insufficient documentation

## 2020-06-23 DIAGNOSIS — Z01812 Encounter for preprocedural laboratory examination: Secondary | ICD-10-CM | POA: Insufficient documentation

## 2020-06-23 LAB — SARS CORONAVIRUS 2 (TAT 6-24 HRS): SARS Coronavirus 2: NEGATIVE

## 2020-06-24 NOTE — H&P (Addendum)
Anna Vance Anna Vance is a 20 y.o. female, G2P1001, IUP at 39 weeks, presenting for mild polyhydramnios, AFI 25 last US on 3/15, EFW 6.13lbs 33%BPP 8/8, vertex, anterior placenta. Pt endorse + Fm. Denies vaginal leakage. Denies vaginal bleeding. Denies feeling cxt's.   Asthma (stable, no meds) bipolar disorder/PTSD/PPD (Stable, declined meds, has been referred to [facility]inger center) Cerclage/Shortened cervic (Placed 11/19, Removed 3/2 at 36.2 weeks) Group B Streptococcus carrier (allergy to penicillin, susceptibility not performed)  perinatal transient vaginal bleeding Vit D Def (Rx vit D 4000 U daily, recheck PP.)  Patient Active Problem List   Diagnosis Date Noted  . GBS bacteriuria--dx 05/02/19 05/03/2020  . Braxton Hick's contraction 05/01/2020  . Polyhydramnios 05/01/2020  . Vaginal bleeding in pregnancy, third trimester 04/30/2020  . PTSD (post-traumatic stress disorder) 04/30/2020  . Hx of bipolar disorder 04/30/2020  . Hx of major depression 04/30/2020  . Vitamin D deficiency 04/30/2020  . Asthma 04/30/2020  . Incompetent cervix during second trimester, antepartum 02/24/2020     Medications Prior to Admission  Medication Sig Dispense Refill Last Dose  . albuterol (VENTOLIN HFA) 108 (90 Base) MCG/ACT inhaler Inhale 2 puffs into the lungs every 6 (six) hours as needed for wheezing or shortness of breath.     . cholecalciferol (VITAMIN D) 25 MCG tablet Take 4 tablets (4,000 Units total) by mouth daily. 30 tablet 0   . EPINEPHrine 0.3 mg/0.3 mL IJ SOAJ injection Inject 0.3 mg into the muscle as needed for anaphylaxis.      Marland Kitchen. ondansetron (ZOFRAN ODT) 4 MG disintegrating tablet Take 1-2 tablets (4-8 mg total) by mouth every 8 (eight) hours as needed for nausea or vomiting. 20 tablet 0   . Prenatal Vit-Fe Fumarate-FA (MULTIVITAMIN-PRENATAL) 27-0.8 MG TABS tablet Take 1 tablet by mouth daily at 12 noon.       Past Medical History:  Diagnosis Date  . Asthma   . Vertigo      No  current facility-administered medications on file prior to encounter.   Current Outpatient Medications on File Prior to Encounter  Medication Sig Dispense Refill  . albuterol (VENTOLIN HFA) 108 (90 Base) MCG/ACT inhaler Inhale 2 puffs into the lungs every 6 (six) hours as needed for wheezing or shortness of breath.    . cholecalciferol (VITAMIN D) 25 MCG tablet Take 4 tablets (4,000 Units total) by mouth daily. 30 tablet 0  . EPINEPHrine 0.3 mg/0.3 mL IJ SOAJ injection Inject 0.3 mg into the muscle as needed for anaphylaxis.     Marland Kitchen. ondansetron (ZOFRAN ODT) 4 MG disintegrating tablet Take 1-2 tablets (4-8 mg total) by mouth every 8 (eight) hours as needed for nausea or vomiting. 20 tablet 0  . Prenatal Vit-Fe Fumarate-FA (MULTIVITAMIN-PRENATAL) 27-0.8 MG TABS tablet Take 1 tablet by mouth daily at 12 noon.       Allergies  Allergen Reactions  . Chocolate Hives  . Other Hives    Peanuts and strawberries  . Penicillins     Did it involve swelling of the face/tongue/throat, SOB, or low BP? Unknown Did it involve sudden or severe rash/hives, skin peeling, or any reaction on the inside of your mouth or nose? Unknown Did you need to seek medical attention at a hospital or doctor's office? Unknown When did it last happen?child If all above answers are "NO", may proceed with cephalosporin use.      History of present pregnancy: Pt Info/Preference:  Screening/Consents:  Labs:   EDD: Estimated Date of Delivery: None noted.  Establised: No LMP recorded.  Anatomy Scan: Date: 11/3 Placenta Location: right anterior Genetic Screen: Panoroma:LR Female AFP: Neg First Tri: Quad: Horizon: Negative  Office: [facility]             First PNV: 11.2 wg Blood Type --/--/O POS (01/24 2052)  Language: English Last PNV: 38.2 wg Rhogam    Flu Vaccine:  declined   Antibody NEG (01/24 2052)  TDaP vaccine declined   GTT: Early: 5.2 Third Trimester: normal  Feeding Plan: Breast/bottle BTL: no Rubella: Immune  (09/07 0000)  Contraception: ??? VBAC: no RPR: Nonreactive (09/07 0000)   Circumcision: Female   HBsAg/HCV: Negative (09/07 0000)/Negative  Pediatrician:  ???   HIV: Non-reactive (09/07 0000)   Prenatal Classes: no Additional [place]: AFI 25 last [place] on 3/15, EFW 6.13lbs 33%BPP 8/8, vertex, anterior placenta. GBS:  NOB + in urine (For PCN allergy, check sensitivities)       Chlamydia: neg    MFM Referral/Consult:  GC: neg  Support Person: partner   PAP: ???  Pain Management: epidural Neonatologist Referral:  Hgb Electrophoresis:  AA  Birth Plan: DCC   Hgb NOB: 13.3    28W: 12.2   OB History    Gravida  2   Para  1   Term  1   Preterm      AB      Living  1     SAB      IAB      Ectopic      Multiple      Live Births  1          Past Medical History:  Diagnosis Date  . Asthma   . Vertigo    Past Surgical History:  Procedure Laterality Date  . CERVICAL CERCLAGE N/A 02/24/2020   Procedure: CERCLAGE CERVICAL;  Surgeon: Osborn Coho, MD;  Location: [facility] LD ORS;  Service: Gynecology;  Laterality: N/A;  . NO PAST SURGERIES     Family History: family history includes Bipolar disorder in her mother; Diabetes in her father; High Cholesterol in her father; Hypertension in her father; Renal Disease in her mother. Social History:  reports that she has never smoked. She has never used smokeless tobacco. She reports that she does not drink alcohol and does not use drugs.   Prenatal Transfer Tool  Maternal Diabetes: No Genetic Screening: Normal Maternal Ultrasounds/Referrals: Normal Fetal Ultrasounds or other Referrals:  None Maternal Substance Abuse:  No Significant Maternal Medications:  None Significant Maternal Lab Results: Group B Strep positive  ROS:  Review of Systems  Constitutional: Negative.   HENT: Negative.   Eyes: Negative.   Respiratory: Negative.   Cardiovascular: Negative.   Gastrointestinal: Negative.   Genitourinary: Negative.   Musculoskeletal:  Negative.   Skin: Negative.   Neurological: Negative.   Endo/Heme/Allergies: Negative.   Psychiatric/Behavioral: Negative.      Physical Exam: BP (!) 99/51   Pulse 72   Temp 99.1 F (37.3 C) (Oral)   Resp 17   Physical Exam Vitals and nursing note reviewed. Exam conducted with a chaperone present.  HENT:     Head: Normocephalic and atraumatic.     Nose: Nose normal.     Mouth/Throat:     Mouth: Mucous membranes are moist.  Eyes:     Conjunctiva/sclera: Conjunctivae normal.  Cardiovascular:     Rate and Rhythm: Normal rate and regular rhythm.     Pulses: Normal pulses.     Heart sounds: Normal heart sounds.  Pulmonary:     Effort: Pulmonary effort is normal.  Abdominal:     General: Bowel sounds are normal.  Genitourinary:    Comments: Uterus gravida, pelvis adequate for vaginal delivery.  Musculoskeletal:        General: Normal range of motion.     Cervical back: Normal range of motion and neck supple.  Skin:    General: Skin is warm.     Capillary Refill: Capillary refill takes less than 2 seconds.  Neurological:     General: No focal deficit present.     Mental Status: She is alert.  Psychiatric:        Mood and Affect: Mood normal.        Behavior: Behavior normal.      NST: FHR baseline 150 bpm, Variability: absent, Accelerations:present, Decelerations:  x1 late, x1 early, position change and fluid bolus given= Cat 2/Reactive UC:   Occ SVE Dilation: 1.5 Effacement (%): Thick Station: Ballotable Exam by:: J. Velita Quirk, CNM, vertex verified by fetal sutures.  Leopold's: Position vertex, EFW 6.5lbs via leopold's. Pelvis proven:  7.2lbs.  Foley bulb placed with ease 60 mls NS instilled pt tolerated well.   Labs: No results found for this or any previous visit (from the past 24 hour(s)).  Imaging:  No results found.  MAU Course: Orders Placed This Encounter  Procedures  . CBC  . RPR  . Diet clear liquid Room service appropriate? Yes; Fluid consistency:  Thin  . Vitals signs per unit policy  . Notify Physician  . Fetal monitoring per unit policy  . Activity as tolerated  . Measure blood pressure post delivery every 15 min x 1 hour then every 30 min x 1 hour  . Fundal check post delivery every 15 min x 1 hour then every 30 min x 1 hour  . If Rapid HIV test positive or known HIV positive: initiate AZT orders  . May in and out cath x 2 for inability to void  . Insert foley catheter  . Discontinue foley prior to vaginal delivery  . Initiate Carrier Fluid Protocol  . Initiate Oral Care Protocol  . Order Rapid HIV per protocol if no results on chart  . Patient may have epidural placement upon request  . Evaluate fetal heart rate to establish reassuring pattern prior to initiating Cytotec or Pitocin  . Perform a cervical exam prior to initiating Cytotec or Pitocin  . Discontinue Pitocin if tachysystole with non-reassuring FHR is present  . Nofify MD/CNM if tachysystole with non-reassuring FHR is present  . Initiate intrauterine resuscitation if tachysystole with non-reasuring FHR is present  . If tachysystole WITH reassuring FHR present notify MD / CNM  . May administer Terbutaline 0.25 mg SQ x 1 dose if tachysystole with non-reassuring FHR is presesnt  . Labor Induction  . Full code  . Type and screen  . Insert and maintain IV Line  . Admit to Inpatient (patient's expected length of stay will be greater than 2 midnights or inpatient only procedure)   Meds ordered this encounter  Medications  . lactated ringers infusion  . oxytocin (PITOCIN) IV BOLUS FROM BAG  . oxytocin (PITOCIN) IV infusion 30 units in NS 500 mL - Premix  . lactated ringers infusion 500-1,000 mL  . acetaminophen (TYLENOL) tablet 650 mg  . ondansetron (ZOFRAN) injection 4 mg  . sodium citrate-citric acid (ORACIT) solution 30 mL  . lidocaine (PF) (XYLOCAINE) 1 % injection 30 mL  . vancomycin (VANCOCIN) IVPB 1000  mg/200 mL premix    Order Specific Question:    Indication:    Answer:   Other Indication (list below)    Order Specific Question:   Other Indication:    Answer:   Group B Strep Prophylaxis  . fentaNYL (SUBLIMAZE) injection 50-100 mcg  . terbutaline (BRETHINE) injection 0.25 mg  . misoprostol (CYTOTEC) tablet 25 mcg    Assessment/Plan: Anna Vance Anna Vance is a 20 y.o. female, G2P1001, IUP at 39 weeks, presenting for mild polyhydramnios, AFI 25 last [place] on 3/15, EFW 6.13lbs 33%BPP 8/8, vertex, anterior placenta. Pt endorse + Fm. Denies vaginal leakage. Denies vaginal bleeding. Pt cxt to frequent for cytotec 3-4 in 10, x1 late decel noted, and x1 early , will proceed with IOL slow  Asthma (stable, no meds) bipolar disorder/PTSD/PPD (Stable, declined meds, has been referred to ringer center) Cerclage/Shortened cervic (Placed 11/19, Removed 3/2 at 36.2 weeks) Group B Streptococcus carrier (allergy to penicillin, susceptibility not performed)  perinatal transient vaginal bleeding Vit D Def (Rx vit D 4000 U daily, recheck PP.)  FWB: Cat 2 Fetal Tracing: Resolved with fluid bolus and position change.  Plan: Admit to Birthing Suite per consult with Dr Mora Appl Routine [facility] orders Pain med/epidural prn Vancomycin for GBS prophylaxis Foley bulb Anticipate labor progression   Dale [place] NP-C, CNM, MSN 06/25/2020, 12:53 AM

## 2020-06-25 ENCOUNTER — Inpatient Hospital Stay (HOSPITAL_COMMUNITY): Payer: Medicaid Other | Admitting: Anesthesiology

## 2020-06-25 ENCOUNTER — Encounter (HOSPITAL_COMMUNITY): Payer: Self-pay | Admitting: Obstetrics & Gynecology

## 2020-06-25 ENCOUNTER — Inpatient Hospital Stay (HOSPITAL_COMMUNITY)
Admission: AD | Admit: 2020-06-25 | Discharge: 2020-06-28 | DRG: 768 | Disposition: A | Discharge status: Home / Self Care | Payer: Medicaid Other | Attending: Obstetrics & Gynecology | Admitting: Obstetrics & Gynecology

## 2020-06-25 ENCOUNTER — Inpatient Hospital Stay (HOSPITAL_COMMUNITY): Payer: Medicaid Other

## 2020-06-25 ENCOUNTER — Other Ambulatory Visit: Payer: Self-pay

## 2020-06-25 DIAGNOSIS — J45909 Unspecified asthma, uncomplicated: Secondary | ICD-10-CM | POA: Diagnosis present

## 2020-06-25 DIAGNOSIS — Z3A39 39 weeks gestation of pregnancy: Secondary | ICD-10-CM

## 2020-06-25 DIAGNOSIS — Z880 Allergy status to penicillin: Secondary | ICD-10-CM

## 2020-06-25 DIAGNOSIS — O403XX0 Polyhydramnios, third trimester, not applicable or unspecified: Principal | ICD-10-CM | POA: Diagnosis present

## 2020-06-25 DIAGNOSIS — O409XX0 Polyhydramnios, unspecified trimester, not applicable or unspecified: Secondary | ICD-10-CM | POA: Diagnosis present

## 2020-06-25 DIAGNOSIS — O99824 Streptococcus B carrier state complicating childbirth: Secondary | ICD-10-CM | POA: Diagnosis present

## 2020-06-25 DIAGNOSIS — O9952 Diseases of the respiratory system complicating childbirth: Secondary | ICD-10-CM | POA: Diagnosis present

## 2020-06-25 HISTORY — DX: Depression, unspecified: F32.A

## 2020-06-25 HISTORY — DX: Polycystic ovarian syndrome: E28.2

## 2020-06-25 LAB — TYPE AND SCREEN
ABO/RH(D): O POS
Antibody Screen: NEGATIVE

## 2020-06-25 LAB — CBC
HCT: 35.8 % — ABNORMAL LOW (ref 36.0–46.0)
Hemoglobin: 12.6 g/dL (ref 12.0–15.0)
MCH: 33.2 pg (ref 26.0–34.0)
MCHC: 35.2 g/dL (ref 30.0–36.0)
MCV: 94.2 fL (ref 80.0–100.0)
Platelets: 196 10*3/uL (ref 150–400)
RBC: 3.8 MIL/uL — ABNORMAL LOW (ref 3.87–5.11)
RDW: 13.2 % (ref 11.5–15.5)
WBC: 6.5 10*3/uL (ref 4.0–10.5)
nRBC: 0 % (ref 0.0–0.2)

## 2020-06-25 LAB — RPR: RPR Ser Ql: NONREACTIVE

## 2020-06-25 MED ORDER — CEFAZOLIN SODIUM-DEXTROSE 2-4 GM/100ML-% IV SOLN: 2.0000 g | Freq: Once | INTRAVENOUS | Status: DC

## 2020-06-25 MED ORDER — DIPHENHYDRAMINE HCL 50 MG/ML IJ SOLN: 12.5000 mg | INTRAMUSCULAR | Status: DC | PRN

## 2020-06-25 MED ORDER — MISOPROSTOL 25 MCG QUARTER TABLET: 25.0000 ug | ORAL_TABLET | ORAL | Status: DC | PRN

## 2020-06-25 MED ORDER — FENTANYL-BUPIVACAINE-NACL 0.5-0.125-0.9 MG/250ML-% EP SOLN: 12.0000 mL/h | EPIDURAL | Status: DC | PRN

## 2020-06-25 MED ORDER — SODIUM CHLORIDE 0.9 % IV SOLN
12.5000 mg | Freq: Four times a day (QID) | INTRAVENOUS | Status: DC | PRN
  Filled 2020-06-25: qty 0.5

## 2020-06-25 MED ORDER — CEFAZOLIN SODIUM-DEXTROSE 2-4 GM/100ML-% IV SOLN
2.0000 g | Freq: Once | INTRAVENOUS | Status: AC
  Administered 2020-06-25: 2 g via INTRAVENOUS
  Filled 2020-06-25: qty 100

## 2020-06-25 MED ORDER — EPHEDRINE 5 MG/ML INJ: 10.0000 mg | INTRAVENOUS | Status: DC | PRN

## 2020-06-25 MED ORDER — PHENYLEPHRINE 40 MCG/ML (10ML) SYRINGE FOR IV PUSH (FOR BLOOD PRESSURE SUPPORT): 80.0000 ug | PREFILLED_SYRINGE | INTRAVENOUS | Status: DC | PRN

## 2020-06-25 MED ORDER — OXYTOCIN-SODIUM CHLORIDE 30-0.9 UT/500ML-% IV SOLN: 1.0000 m[IU]/min | INTRAVENOUS | Status: DC

## 2020-06-25 MED ORDER — LACTATED RINGERS IV SOLN: 500.0000 mL | Freq: Once | INTRAVENOUS | Status: DC

## 2020-06-25 MED ORDER — LIDOCAINE HCL (PF) 1 % IJ SOLN: 30.0000 mL | INTRAMUSCULAR | Status: DC | PRN

## 2020-06-25 MED ORDER — OXYTOCIN-SODIUM CHLORIDE 30-0.9 UT/500ML-% IV SOLN
1.0000 m[IU]/min | INTRAVENOUS | Status: DC
  Administered 2020-06-25: 2 m[IU]/min via INTRAVENOUS
  Filled 2020-06-25: qty 500

## 2020-06-25 MED ORDER — SOD CITRATE-CITRIC ACID 500-334 MG/5ML PO SOLN: 30.0000 mL | ORAL | Status: DC | PRN

## 2020-06-25 MED ORDER — FENTANYL CITRATE (PF) 100 MCG/2ML IJ SOLN
50.0000 ug | INTRAMUSCULAR | Status: DC | PRN
  Administered 2020-06-25: 50 ug via INTRAVENOUS
  Administered 2020-06-25 (×3): 100 ug via INTRAVENOUS
  Filled 2020-06-25 (×4): qty 2

## 2020-06-25 MED ORDER — LACTATED RINGERS IV SOLN: INTRAVENOUS | Status: DC

## 2020-06-25 MED ORDER — PHENYLEPHRINE 40 MCG/ML (10ML) SYRINGE FOR IV PUSH (FOR BLOOD PRESSURE SUPPORT)
80.0000 ug | PREFILLED_SYRINGE | INTRAVENOUS | Status: DC | PRN
Start: 2020-06-25 — End: 2020-06-25

## 2020-06-25 MED ORDER — VANCOMYCIN HCL IN DEXTROSE 1-5 GM/200ML-% IV SOLN
1000.0000 mg | Freq: Two times a day (BID) | INTRAVENOUS | Status: DC
  Administered 2020-06-25: 1000 mg via INTRAVENOUS
  Filled 2020-06-25: qty 200

## 2020-06-25 MED ORDER — LIDOCAINE-EPINEPHRINE (PF) 2 %-1:200000 IJ SOLN
INTRAMUSCULAR | Status: DC | PRN
  Administered 2020-06-25: 5 mL via EPIDURAL

## 2020-06-25 MED ORDER — TERBUTALINE SULFATE 1 MG/ML IJ SOLN: 0.2500 mg | Freq: Once | INTRAMUSCULAR | Status: DC | PRN

## 2020-06-25 MED ORDER — ONDANSETRON HCL 4 MG/2ML IJ SOLN
4.0000 mg | Freq: Four times a day (QID) | INTRAMUSCULAR | Status: DC | PRN
  Administered 2020-06-25 (×2): 4 mg via INTRAVENOUS
  Filled 2020-06-25 (×2): qty 2

## 2020-06-25 MED ORDER — CEFAZOLIN SODIUM-DEXTROSE 1-4 GM/50ML-% IV SOLN
1.0000 g | Freq: Three times a day (TID) | INTRAVENOUS | Status: DC
  Administered 2020-06-25 (×2): 1 g via INTRAVENOUS
  Filled 2020-06-25 (×6): qty 50

## 2020-06-25 MED ORDER — ACETAMINOPHEN 325 MG PO TABS: 650.0000 mg | ORAL_TABLET | ORAL | Status: DC | PRN

## 2020-06-25 MED ORDER — OXYTOCIN-SODIUM CHLORIDE 30-0.9 UT/500ML-% IV SOLN
2.5000 [IU]/h | INTRAVENOUS | Status: DC
  Administered 2020-06-26: 2.5 [IU]/h via INTRAVENOUS

## 2020-06-25 MED ORDER — CEFAZOLIN SODIUM-DEXTROSE 1-4 GM/50ML-% IV SOLN: 1.0000 g | Freq: Three times a day (TID) | INTRAVENOUS | Status: DC

## 2020-06-25 MED ORDER — LACTATED RINGERS IV SOLN
500.0000 mL | Freq: Once | INTRAVENOUS | Status: AC
  Administered 2020-06-25: 500 mL via INTRAVENOUS

## 2020-06-25 MED ORDER — FENTANYL-BUPIVACAINE-NACL 0.5-0.125-0.9 MG/250ML-% EP SOLN
12.0000 mL/h | EPIDURAL | Status: DC | PRN
  Administered 2020-06-25: 12 mL/h via EPIDURAL
  Filled 2020-06-25: qty 250

## 2020-06-25 MED ORDER — LACTATED RINGERS IV SOLN
500.0000 mL | INTRAVENOUS | Status: DC | PRN
  Administered 2020-06-25: 500 mL via INTRAVENOUS
  Administered 2020-06-25: 250 mL via INTRAVENOUS

## 2020-06-25 MED ORDER — OXYTOCIN BOLUS FROM INFUSION
333.0000 mL | Freq: Once | INTRAVENOUS | Status: AC
  Administered 2020-06-26: 333 mL via INTRAVENOUS

## 2020-06-25 NOTE — Progress Notes (Signed)
Subjective:    Beginning to become more uncomfortable w/ ctx. Plans IV sedation for pain management in active labor. Discussed amniotomy if vertex is well-engaged and pt agrees.   Objective:    VS: BP 115/64   Pulse 69   Temp 98.9 F (37.2 C) (Oral)   Resp 18   Ht 5\' 1"  (1.549 m)   Wt 67.4 kg   LMP 09/25/2019   BMI 28.08 kg/m  FHR : baseline 140 / variability moderate / accelerations present / absent decelerations Toco: contractions every 2 minutes  Membranes: intact Dilation: 5 Effacement (%): 80 Cervical Position: Middle Station: -3 Presentation: Vertex Exam by:: [email or web address]. CNM Pitocin 10 mU/min  Assessment/Plan:   20 y.o. G2P1001 [redacted]w[redacted]d IOL for polyhydramnios  Labor: Progressing on Pitocin, will continue to increase then AROM vertex is not well enganged, will increase Pitocin and plan for AROM w/ FSE during next exam.  Fetal Wellbeing:  Category I Pain Control:  plans IV sedation I/D:  GBS pos, treated w/ Ancef 2G load and 1G Q 8 hrs Anticipated MOD:  NSVD  [redacted]w[redacted]d MSN, CNM 06/25/2020 1:24 PM

## 2020-06-25 NOTE — Progress Notes (Signed)
Subjective:    Has become more uncomfortable w/ ctx. Using birthing ball and freedom of movement of for pain management.   Objective:    VS: BP (!) 141/93   Pulse 72   Temp 98 F (36.7 C) (Axillary)   Resp 16   Ht 5\' 1"  (1.549 m)   Wt 67.4 kg   LMP 09/25/2019   BMI 28.08 kg/m  FHR : baseline 135 / variability moderate / accelerations present / absent decelerations Toco: contractions every 1.5-2 minutes  Membranes: AROM, clear fluid Dilation: 5 Effacement (%): 80 Cervical Position: Middle Station: -2 Presentation: Vertex Exam by:: [email or web address], CNM Pitocin 10 mU/min  Assessment/Plan:   20 y.o. G2P1001 [redacted]w[redacted]d  Labor: Latent labor, becoming more uncomfortable Fetal Wellbeing:  Category I Pain Control:  Labor support without medications I/D:  GBS pos, PCN allergy, Ancef x2 doses Anticipated MOD:  NSVD  [redacted]w[redacted]d MSN, CNM 06/25/2020 5:23 PM

## 2020-06-25 NOTE — Progress Notes (Signed)
Subjective:    Called by RN for SVE because pt reports constant rectal pressure. Moaning w/ ctx and very uncomfortable. Agrees to IUPC placement. Assisted to exaggerated left lateral w/ peanut ball to facilitate fetal rotation and decent.   Objective:    VS: BP 133/86   Pulse 68   Temp 98 F (36.7 C) (Axillary)   Resp (!) 21   Ht 5\' 1"  (1.549 m)   Wt 67.4 kg   LMP 09/25/2019   BMI 28.08 kg/m  FHR : baseline 135 / variability moderate / accelerations present / variable decelerations Toco: contractions every 2 minutes / MVU 210 Membranes: AROM x1 hr, remains clear Dilation: 5 Effacement (%): 80 Cervical Position: Middle Station: 0 Presentation: Vertex Exam by:: [email or web address], CNM Pitocin 12 mU/min, reduced to 8 mU/min  Assessment/Plan:   20 y.o. G2P1001 [redacted]w[redacted]d  Labor: protracted latent phase, IUPC placed, will titrate Pitocin to maintain MVUs 180-200 Fetal Wellbeing:  Category I and Category II Pain Control:  IV sedation I/D:  GBS pos, Ancef x2 doses Anticipated MOD:  NSVD  [redacted]w[redacted]d MSN, CNM 06/25/2020 6:38 PM

## 2020-06-25 NOTE — Anesthesia Preprocedure Evaluation (Addendum)
Anesthesia Evaluation  Patient identified by MRN, date of birth, ID band Patient awake    Reviewed: Allergy & Precautions, NPO status , Patient's Chart, lab work & pertinent test results  Airway Mallampati: I       Dental no notable dental hx.    Pulmonary asthma ,    Pulmonary exam normal        Cardiovascular negative cardio ROS Normal cardiovascular exam     Neuro/Psych PSYCHIATRIC DISORDERS Anxiety Depression negative neurological ROS     GI/Hepatic negative GI ROS, Neg liver ROS,   Endo/Other  negative endocrine ROS  Renal/GU negative Renal ROS     Musculoskeletal   Abdominal   Peds  Hematology negative hematology ROS (+)   Anesthesia Other Findings   Reproductive/Obstetrics (+) Pregnancy                            Anesthesia Physical Anesthesia Plan  ASA: II  Anesthesia Plan: Epidural   Post-op Pain Management:    Induction:   PONV Risk Score and Plan: 0  Airway Management Planned: Natural Airway and Simple Face Mask  Additional Equipment: None  Intra-op Plan:   Post-operative Plan:   Informed Consent: I have reviewed the patients History and Physical, chart, labs and discussed the procedure including the risks, benefits and alternatives for the proposed anesthesia with the patient or authorized representative who has indicated his/her understanding and acceptance.       Plan Discussed with:   Anesthesia Plan Comments: (Lab Results      Component                Value               Date                      WBC                      6.5                 06/25/2020                HGB                      12.6                06/25/2020                HCT                      35.8 (L)            06/25/2020                MCV                      94.2                06/25/2020                PLT                      196                 06/25/2020           )        Anesthesia Quick  Evaluation

## 2020-06-25 NOTE — Plan of Care (Signed)

## 2020-06-25 NOTE — Progress Notes (Signed)
Subjective:    Comfortable w/ epidural, S/O present and supportive.   Objective:    VS: BP 131/75   Pulse 64   Temp 98.9 F (37.2 C) (Oral)   Resp 16   Ht 5\' 1"  (1.549 m)   Wt 67.4 kg   LMP 09/25/2019   SpO2 100%   BMI 28.08 kg/m  FHR : baseline 135 / variability moderate / accelerations present / absent decelerations IUPC: contractions every 2-4 minutes / MVU 140 Membranes: AROM x 5.5 hrs, remains clear   Dilation: 7 Effacement (%): 100 Cervical Position: Middle Station: 0 Presentation: Vertex Exam by:: [email or web address], RN Pitocin 10 mU/min  Assessment/Plan:   20 y.o. G2P1001 [redacted]w[redacted]d  Labor: Progressing normally Fetal Wellbeing:  Category I Pain Control:  Epidural I/D:  GBS pos, being treated w/ Ancef Anticipated MOD:  NSVD  [redacted]w[redacted]d MSN, CNM 06/25/2020 10:36 PM

## 2020-06-25 NOTE — Anesthesia Procedure Notes (Signed)
Epidural Patient location during procedure: OB Start time: 06/25/2020 7:06 PM End time: 06/25/2020 7:12 PM  Staffing Anesthesiologist: Shelton Silvas, MD Performed: anesthesiologist   Preanesthetic Checklist Completed: patient identified, IV checked, site marked, risks and benefits discussed, surgical consent, monitors and equipment checked, pre-op evaluation and timeout performed  Epidural Patient position: sitting Prep: DuraPrep Patient monitoring: heart rate, continuous pulse ox and blood pressure Approach: midline Location: L3-L4 Injection technique: LOR saline  Needle:  Needle type: Tuohy  Needle gauge: 17 G Needle length: 9 cm Catheter type: closed end flexible Catheter size: 20 Guage Test dose: negative and 1.5% lidocaine  Assessment Events: blood not aspirated, injection not painful, no injection resistance and no paresthesia  Additional Notes LOR @ 5  Patient identified. Risks/Benefits/Options discussed with patient including but not limited to bleeding, infection, nerve damage, paralysis, failed block, incomplete pain control, headache, blood pressure changes, nausea, vomiting, reactions to medications, itching and postpartum back pain. Confirmed with bedside nurse the patient's most recent platelet count. Confirmed with patient that they are not currently taking any anticoagulation, have any bleeding history or any family history of bleeding disorders. Patient expressed understanding and wished to proceed. All questions were answered. Sterile technique was used throughout the entire procedure. Please see nursing notes for vital signs. Test dose was given through epidural catheter and negative prior to continuing to dose epidural or start infusion. Warning signs of high block given to the patient including shortness of breath, tingling/numbness in hands, complete motor block, or any concerning symptoms with instructions to call for help. Patient was given instructions on  fall risk and not to get out of bed. All questions and concerns addressed with instructions to call with any issues or inadequate analgesia.    Reason for block:procedure for pain

## 2020-06-25 NOTE — Progress Notes (Signed)
Labor Progress Note  Anna Vance Jeany [place] is a 20 y.o. female, G2P1001, IUP at 39 weeks, presenting for mild polyhydramnios, AFI 25 last [place] on 3/15, EFW 6.13lbs 33%BPP 8/8, vertex, anterior placenta. Pt endorse + Fm. Denies vaginal leakage. Denies vaginal bleeding. Denies feeling cxt's.   Asthma (stable, no meds) bipolar disorder/PTSD/PPD (Stable, declined meds, has been referred to ringer center) Cerclage/Shortened cervic (Placed 11/19, Removed 3/2 at 36.2 weeks) Group B Streptococcus carrier (allergy to penicillin, susceptibility not performed)  perinatal transient vaginal bleeding Vit D Def (Rx vit D 4000 U daily, recheck PP.)  Subjective: Pt stable in NAD, feeling cxt, but tolerates well, still smiling. Discusses POC, pt questions ansered Patient Active Problem List   Diagnosis Date Noted  . GBS bacteriuria--dx 05/02/19 05/03/2020  . Braxton Hick's contraction 05/01/2020  . Polyhydramnios 05/01/2020  . Vaginal bleeding in pregnancy, third trimester 04/30/2020  . PTSD (post-traumatic stress disorder) 04/30/2020  . Hx of bipolar disorder 04/30/2020  . Hx of major depression 04/30/2020  . Vitamin D deficiency 04/30/2020  . Asthma 04/30/2020  . Incompetent cervix during second trimester, antepartum 02/24/2020   Objective: BP 109/67   Pulse 61   Temp 99.1 F (37.3 C) (Oral)   Resp 17   Ht 5\' 1"  (1.549 m)   Wt 67.4 kg   LMP 09/25/2019   BMI 28.08 kg/m  No intake/output data recorded. No intake/output data recorded. NST: FHR baseline 120 bpm, Variability: moderate, Accelerations:present, Decelerations:  Absent= Cat 1/Reactive CTX:  none Uterus gravid, soft non tender, moderate to palpate with contractions.  SVE:  Dilation: 3 Effacement (%): 50 Station: 09/27/2019 Exam by:: [email or web address], CNM Pitocin at (To start in one our) mUn/min  Assessment:  Anna Vance is a 20 y.o. female, G2P1001, IUP at 39 weeks, presenting for mild polyhydramnios, AFI 25  last 36 on 3/15, EFW 6.13lbs 33%BPP 8/8, vertex, anterior placenta. Pt endorse + Fm. Denies vaginal leakage. Denies vaginal bleeding. Pt feeling cxt now in early/latent labor, stable, desires natural delivery. Foley bulb still in. Pt requesting to eat, then agrees to starting pitocin, bedside 4/15 showed vertex.   Asthma (stable, no meds) bipolar disorder/PTSD/PPD (Stable, declined meds, has been referred to ringer center) Cerclage/Shortened cervic (Placed 11/19, Removed 3/2 at 36.2 weeks) Group B Streptococcus carrier (allergy to penicillin, susceptibility not performed)  perinatal transient vaginal bleeding Vit D Def (Rx vit D 4000 U daily, recheck PP.) Patient Active Problem List   Diagnosis Date Noted  . GBS bacteriuria--dx 05/02/19 05/03/2020  . Braxton Hick's contraction 05/01/2020  . Polyhydramnios 05/01/2020  . Vaginal bleeding in pregnancy, third trimester 04/30/2020  . PTSD (post-traumatic stress disorder) 04/30/2020  . Hx of bipolar disorder 04/30/2020  . Hx of major depression 04/30/2020  . Vitamin D deficiency 04/30/2020  . Asthma 04/30/2020  . Incompetent cervix during second trimester, antepartum 02/24/2020   NICHD: Category 1  Membranes:  Intact, fetus ballatolable, no s/s of infection   Induction:    Cytotec xN/A cxt too much to place when pt got here  Foley Bulb: inserted 3/21 @ 0100  Pitocin - To begin in 1 hour  Pain management:               IV pain management: xPRN             Epidural placement:  PRN, desires natural   GBS Positive  Abx: started on vanc received 1gm, proceeded to get hives on right forearm where IV was infusing, vanc  d/c'ed added to allergy list, pt endorses tolerates keflex well in the past, pnc allergy unknown and childhood, ordered to start keflex.    Plan: Continue labor plan Continuous monitoring Rest Ambulate Frequent position changes to facilitate fetal rotation and descent. Will reassess with cervical exam at 4 hours or  earlier if necessary Anticipate foley bulb out in 4 hours.  Pt may eat small desires labor meal then begin pitocin per protocol Anticipate labor progression and vaginal delivery.  Estimated Birth Time 5:27pm today.    Dale [place], NP-C, CNM, MSN 06/25/2020. 6:24 AM

## 2020-06-26 ENCOUNTER — Encounter (HOSPITAL_COMMUNITY): Payer: Self-pay | Admitting: Obstetrics & Gynecology

## 2020-06-26 LAB — CBC
HCT: 35 % — ABNORMAL LOW (ref 36.0–46.0)
Hemoglobin: 12.2 g/dL (ref 12.0–15.0)
MCH: 32.7 pg (ref 26.0–34.0)
MCHC: 34.9 g/dL (ref 30.0–36.0)
MCV: 93.8 fL (ref 80.0–100.0)
Platelets: 173 10*3/uL (ref 150–400)
RBC: 3.73 MIL/uL — ABNORMAL LOW (ref 3.87–5.11)
RDW: 13.2 % (ref 11.5–15.5)
WBC: 12.4 10*3/uL — ABNORMAL HIGH (ref 4.0–10.5)
nRBC: 0 % (ref 0.0–0.2)

## 2020-06-26 MED ORDER — COCONUT OIL OIL: 1.0000 "application " | TOPICAL_OIL | Status: DC | PRN

## 2020-06-26 MED ORDER — ONDANSETRON HCL 4 MG PO TABS: 4.0000 mg | ORAL_TABLET | ORAL | Status: DC | PRN

## 2020-06-26 MED ORDER — SIMETHICONE 80 MG PO CHEW: 80.0000 mg | CHEWABLE_TABLET | ORAL | Status: DC | PRN

## 2020-06-26 MED ORDER — DIPHENHYDRAMINE HCL 25 MG PO CAPS: 25.0000 mg | ORAL_CAPSULE | Freq: Four times a day (QID) | ORAL | Status: DC | PRN

## 2020-06-26 MED ORDER — IBUPROFEN 600 MG PO TABS
600.0000 mg | ORAL_TABLET | Freq: Four times a day (QID) | ORAL | Status: DC
  Administered 2020-06-26 – 2020-06-28 (×8): 600 mg via ORAL
  Filled 2020-06-26 (×9): qty 1

## 2020-06-26 MED ORDER — TETANUS-DIPHTH-ACELL PERTUSSIS 5-2.5-18.5 LF-MCG/0.5 IM SUSY: 0.5000 mL | PREFILLED_SYRINGE | Freq: Once | INTRAMUSCULAR | Status: DC

## 2020-06-26 MED ORDER — TRANEXAMIC ACID-NACL 1000-0.7 MG/100ML-% IV SOLN
1000.0000 mg | INTRAVENOUS | Status: AC
  Administered 2020-06-26: 1000 mg via INTRAVENOUS

## 2020-06-26 MED ORDER — ACETAMINOPHEN 325 MG PO TABS: 650.0000 mg | ORAL_TABLET | ORAL | Status: DC | PRN

## 2020-06-26 MED ORDER — BENZOCAINE-MENTHOL 20-0.5 % EX AERO
1.0000 "application " | INHALATION_SPRAY | CUTANEOUS | Status: DC | PRN
  Administered 2020-06-26: 1 via TOPICAL
  Filled 2020-06-26: qty 56

## 2020-06-26 MED ORDER — TRANEXAMIC ACID-NACL 1000-0.7 MG/100ML-% IV SOLN
INTRAVENOUS | Status: AC
  Filled 2020-06-26: qty 100

## 2020-06-26 MED ORDER — SENNOSIDES-DOCUSATE SODIUM 8.6-50 MG PO TABS
2.0000 | ORAL_TABLET | ORAL | Status: DC
  Administered 2020-06-26 – 2020-06-28 (×2): 2 via ORAL
  Filled 2020-06-26 (×3): qty 2

## 2020-06-26 MED ORDER — PRENATAL MULTIVITAMIN CH
1.0000 | ORAL_TABLET | Freq: Every day | ORAL | Status: DC
  Administered 2020-06-26 – 2020-06-27 (×2): 1 via ORAL
  Filled 2020-06-26 (×2): qty 1

## 2020-06-26 MED ORDER — DIBUCAINE (PERIANAL) 1 % EX OINT: 1.0000 "application " | TOPICAL_OINTMENT | CUTANEOUS | Status: DC | PRN

## 2020-06-26 MED ORDER — WITCH HAZEL-GLYCERIN EX PADS: 1.0000 "application " | MEDICATED_PAD | CUTANEOUS | Status: DC | PRN

## 2020-06-26 MED ORDER — ONDANSETRON HCL 4 MG/2ML IJ SOLN: 4.0000 mg | INTRAMUSCULAR | Status: DC | PRN

## 2020-06-26 MED ORDER — OXYCODONE HCL 5 MG PO TABS
5.0000 mg | ORAL_TABLET | Freq: Four times a day (QID) | ORAL | Status: DC | PRN
  Administered 2020-06-26 – 2020-06-27 (×2): 5 mg via ORAL
  Filled 2020-06-26 (×2): qty 1

## 2020-06-26 NOTE — Social Work (Signed)
CSW acknowledges consult for MOB scoring 12 on [place]. CSW spoke with MOB earlier in the day, providing MOB with necessary resources and education on PPD.  Vivi Barrack, MSW, LCSW [facility]  Clinical Social Worker  [phone] 04-Jul-2020  3:37 PM

## 2020-06-26 NOTE — Progress Notes (Signed)
I have reviewed and concur with students documentation.  

## 2020-06-26 NOTE — Clinical Social Work Maternal (Addendum)
CLINICAL SOCIAL WORK MATERNAL/CHILD NOTE  Patient Details  Name: Anna Vance MRN: [id number] Date of Birth: 09/24/2000  Date:  06/08/2020  Clinical Social Worker Initiating Note:  Takia Runyon, LCSW Date/Time: Initiated:  06/29/2020/1021     Child's Name:  Anna Vance   Biological Parents:  Mother,Father   Need for Interpreter:  None   Reason for Referral:  Current Substance Use/Substance Use During Pregnancy ,Behavioral Health Concerns   Address:  [address] [place] [address]    Phone number:  [phone] (home)     Additional phone number: [phone]  Household Members/Support Persons (HM/SP):   Household Member/Support Person 1,Household Member/Support Person 2   HM/SP Name Relationship DOB or Age  HM/SP -1 Nicholas Vance Significant Other 08-06-89  HM/SP -2 Jermaine Young Child 01-22-16  HM/SP -3        HM/SP -4        HM/SP -5        HM/SP -6        HM/SP -7        HM/SP -8          Natural Supports (not living in the home):  Extended Family   Professional Supports:     Employment: Other (comment) (Has not worked due to high risk pregnancy)   Type of Work:     Education:  Some College   Homebound arranged:    Financial Resources:  Medicaid   Other Resources:  WIC,Food Stamps    Cultural/Religious Considerations Which May Impact Care:  none  Strengths:  Ability to meet basic needs ,Compliance with medical plan ,Home prepared for child ,Pediatrician chosen   Psychotropic Medications:   Not currently taking the medication     Pediatrician:       Pediatrician List:   [place]    [place]    [place]    [place]    [place]    [place]      Pediatrician Fax Number:    Risk Factors/Current Problems:  Substance Use ,Mental Health Concerns    Cognitive State:  Alert ,Linear Thinking ,Insightful    Mood/Affect:  Calm ,Happy    CSW Assessment: CSW met  with the patient at bedside and introduced role. CSW observed infant sleeping in bassinet and MOB was on the face time with her younger child. MOB ended called welcomed CSW into the room. CSW informed MOB the reason for the visit. MOB receptive to the visit. CSW asked about the MOB feelings since giving birth. MOB reports " I am feeling good." MOB reports, " emotionally this pregnancy sucked.I was told I had a high risk pregnancy and was put on bed rest. I could not work or leave the house. I felt like I was going crazy." MOB reports, " I have been feeling good because I feel like I have a support system and a plan when I leave here. I just move from [place] son I am in the process of switching all my stuff over."  MOB acknowledges that her the FOB, mother and sister as supports.  MOB denies feeling of SI/HI. MOB denies current of history of domestic violence at this time.   MOB disclosed she has a history of Bipolar, Major Depression, and PTSD.MOB reports while in [place], [place] she was seeing a outpatient therapist. MOB since moving to [place] she has been given referrals for the [facility] and online services. MOB reports she was diagnosed with post   partum depression in February 2018. MOB reports it was difficult to breast feed her baby and felt like she never wanted to be at home with the baby. MOB reports she started seeing a psychiatrist. MOB reports she was receiving intensive in home therapy services due to being a young mom with her first child. MOB reports she was prescribed Prozac and Abilify  MOB reports she feels only Abilify has worked for her in the past however she does not plan to start the medication again. MOB reports, " I am trying to go the holistic approach, mindfulness and spirituality:"  CSW provided education regarding the baby blues period vs. perinatal mood disorders, discussed treatment and gave resources for mental health follow up if concerns arise. MOB was receptive to  information.  CSW also recommended MOB complete a self-evaluation during the postpartum time period using the New Mom Checklist from Postpartum Progress and encouraged MOB to contact a medical professional if symptoms are noted at any time.MOB reports understanding.   CSW informed MOB about documentation that she tested positive for THC during her pregnancy. MOB reports, Yes, I used during my pregnancy because of my nausea symptoms. MOB reports, " I could not keep anything down and the medication prescribed to me was not working. It help me keep my food down and I could eat three meals a day." MOB reports the last time she used was about a month ago. CSW informed MOB about the hospital drug policy (all infants are drugged screened if MOB is positive for any substance during pregnancy and a CPS reports is made if the infant or MOB test positive. CPS will follow up in the community.  MOB reports a friend had notified about the policy. MOB reports understanding.   CSW provided review of Sudden Infant Death Syndrome (SIDS) precautions and explained no co-sleeping with the infant. MOB reports understanding. CSW asked MOB about items for the infant. MOB reports she a car seat and basinet for the baby. MOB reports she is receiving FS/WIC services. Patient reports no transportation barriers.    CSW identifies no further need for intervention and no barriers to discharge at this time.   CSW Plan/Description:  Hospital Drug Screen Policy Information,Sudden Infant Death Syndrome (SIDS) Education,CSW Will Continue to Monitor Umbilical Cord Tissue Drug Screen Results and Make Report if Warranted,Child Protective Service Report ,No Further Intervention Required/No Barriers to Discharge,Perinatal Mood and Anxiety Disorder (PMADs) Education    Ambrosio Reuter A Stassi Fadely, LCSW 07/02/2020, 11:28 AM  

## 2020-06-26 NOTE — Plan of Care (Signed)
Problem: Education: Goal: Knowledge of General Education information will improve Description: Including pain rating scale, medication(s)/side effects and non-pharmacologic comfort measures Outcome: Completed/Met   Problem: Clinical Measurements: Goal: Respiratory complications will improve Outcome: Completed/Met Goal: Cardiovascular complication will be avoided Outcome: Completed/Met   Problem: Activity: Goal: Risk for activity intolerance will decrease Outcome: Completed/Met   Problem: Elimination: Goal: Will not experience complications related to urinary retention Outcome: Completed/Met   Problem: Pain Managment: Goal: General experience of comfort will improve Outcome: Completed/Met   Problem: Safety: Goal: Ability to remain free from injury will improve Outcome: Completed/Met

## 2020-06-26 NOTE — Social Work (Signed)
Infant UDS positive for THC. CSW made a report to [place] CPS. No barriers to discharge.   Jisselle Poth, MSW, LCSW [facility]  Clinical Social Worker  [phone] 06/16/2020  3:55 PM 

## 2020-06-26 NOTE — Anesthesia Postprocedure Evaluation (Signed)
Anesthesia Post Note  Patient: Anna Vance  Procedure(s) Performed: AN AD HOC LABOR EPIDURAL     Patient location during evaluation: Mother Baby Anesthesia Type: Epidural Level of consciousness: awake, awake and alert and oriented Pain management: pain level controlled Vital Signs Assessment: post-procedure vital signs reviewed and stable Respiratory status: spontaneous breathing and respiratory function stable Cardiovascular status: blood pressure returned to baseline Postop Assessment: no headache, epidural receding, patient able to bend at knees, adequate PO intake, no backache, no apparent nausea or vomiting and able to ambulate Anesthetic complications: no   No complications documented.  Last Vitals:  Vitals:   July 11, 2020 0315 07-11-2020 0456  BP: 111/66 122/74  Pulse: (!) 56 62  Resp: 17 18  Temp: 37 C   SpO2: 100%     Last Pain:  Vitals:   2020-07-11 0456  TempSrc: Oral  PainSc: 5    Pain Goal: Patients Stated Pain Goal: 0 (06/25/20 2243)                 Cleda Clarks

## 2020-06-26 NOTE — Lactation Note (Signed)
This note was copied from a baby's chart. Lactation Consultation Note  Patient Name: Anna Vance DVVOH'Y Date: 06/30/2020 Reason for consult: Term Age:21 hours   P2 mother whose infant is now 34 hours old.  This is a term baby at 39+2 weeks.  Mother breast fed her first child (now 47 years old) for 3 months.  Baby was asleep, however, mother was interested in trying to latch.  Positioned appropriately and basic breast feeding concepts reviewed.  Taught hand expression and mother was able to easily express colostrum drops which I finger fed back to baby.  Latched baby in the football hold but he was not at all interested in feeding.  Demonstrated breast compressions and gentle stimulation; he did not awaken.  Placed him STS on mother's chest where he fell asleep.  Mother will continue to practice hand expression and feed back any EBM she obtains to baby.  She will call her RN/LC for latch assistance as needed.  Father present and asleep on the couch.   Maternal Data Has patient been taught Hand Expression?: Yes Does the patient have breastfeeding experience prior to this delivery?: Yes How long did the patient breastfeed?: 3 months  Feeding Mother's Current Feeding Choice: Breast Milk  LATCH Score Latch: Too sleepy or reluctant, no latch achieved, no sucking elicited.  Audible Swallowing: None  Type of Nipple: Everted at rest and after stimulation (short shafted)  Comfort (Breast/Nipple): Soft / non-tender  Hold (Positioning): Assistance needed to correctly position infant at breast and maintain latch.  LATCH Score: 5   Lactation Tools Discussed/Used    Interventions Interventions: Breast feeding basics reviewed;Assisted with latch;Skin to skin;Breast massage;Hand express;Breast compression;Adjust position;Position options;Support pillows;Education  Discharge    Consult Status Consult Status: Follow-up Date: 06/27/20 Follow-up type: In-patient    Beth R  DelFava Jun 30, 2020, 8:21 AM

## 2020-06-26 NOTE — Plan of Care (Signed)
Problem: Education: Goal: Knowledge of General Education information will improve Description: Including pain rating scale, medication(s)/side effects and non-pharmacologic comfort measures Outcome: Completed/Met   Problem: Clinical Measurements: Goal: Respiratory complications will improve Outcome: Completed/Met Goal: Cardiovascular complication will be avoided Outcome: Completed/Met   Problem: Activity: Goal: Risk for activity intolerance will decrease Outcome: Completed/Met   Problem: Elimination: Goal: Will not experience complications related to urinary retention Outcome: Completed/Met   Problem: Pain Managment: Goal: General experience of comfort will improve Outcome: Completed/Met

## 2020-06-27 NOTE — Progress Notes (Signed)
Post Partum Day #1 Subjective: no complaints, up ad lib, voiding, tolerating PO, + flatus and breast feeding  Objective: Blood pressure 116/81, pulse 62, temperature 98.3 F (36.8 C), temperature source Oral, resp. rate 16, height 5\' 1"  (1.549 m), weight 67.4 kg, last menstrual period 09/25/2019, SpO2 100 %, unknown if currently breastfeeding.  Physical Exam:  General: alert, cooperative, appears stated age and no distress Lochia: appropriate Uterine Fundus: firm Perineum: intact DVT Evaluation: No evidence of DVT seen on physical exam. Negative Homan's sign. No cords or calf tenderness.  Recent Labs    06/25/20 0030 25-Jul-2020 0516  HGB 12.6 12.2  HCT 35.8* 35.0*    Assessment/Plan: Patient desires early discharge, discussed that it will depend on if the  Baby can be d/c'd home.  Circ done today Discussed birth control with patient she desires trial of mini pill  Continue routine post partum care   LOS: 2 days   [facility] 06/27/2020, 11:25 AM

## 2020-06-28 MED ORDER — IBUPROFEN 600 MG PO TABS: 600.0000 mg | ORAL_TABLET | Freq: Four times a day (QID) | ORAL | 0 refills | Status: DC

## 2020-06-28 NOTE — Social Work (Signed)
CSW received a call from [place] worker Kalman Drape of [place]. CPS case worker reports that she was here at the hospital to see the infant and MOB. Larene Beach reports when she spoke with MOB, MOB provided all necessary information but became upset when she learned that [facility] CPS case worker would have to complete a safety check on her child that is currently with relatives in [place]. CSW Elmyra Ricks and [place] escorted CPS case worker to the [facility] room. CSW knocked on the door. CSW was met at the door by the NP [facility], here to visit MOB. NP reports the MOB does not want to talk to any social workers. NP reports that MOB was upset and very tearful. NP reports the mother felt blinded sided about the reports. CSW explained to NP the mother was informed about the [place] infant drug screen policy. NP stated, "I have been here three years and have never heard of this policy." She expresses she did not understand why we (social work) had to call CPS case worker for marijuana substance if its legal in other states." CSW inform NP because marijuana is still illegal in the state of [place] it is policy that we drug screen the infant. CPS case worker explained to NP that MOB was compliant with providing information until she learned the child located in [place] would have a mandatory visit as well by CPS, which is the state's policy. NP asked, so if MOB refuses for CPS case worker to see her today what will happen. CPS case worker explained the MOB has a right to refuse the visit, but the case will be discussed with the child protective supervisor and states attorney's office to determine further action. NP requested CPS case worker call the child protective service supervisor to clarify the next course of action so the MOB can make an informed decision, if the MOB refuses to allow CPS into the room. CPS case worker called her supervisor and notified the NP that it will be discussed with  child protective supervisor and state's attorneys' office. NP notified the MOB. MOB was then agreeable to allow CPS case worker into the room.  CPS case worker met with MOB and will continue to follow up with MOB in the community.   No barriers to discharge. Requested documents sent to CPS.   Kathrin Greathouse, MSW, LCSW [facility] [place] Worker  [phone] 06/28/2020  1:47 PM

## 2020-06-28 NOTE — Lactation Note (Signed)
This note was copied from a baby's chart. Lactation Consultation Note  Patient Name: Anna Vance IWOEH'O Date: 06/28/2020 Reason for consult: Follow-up assessment;Term;Infant weight loss;Other (Comment);Hyperbilirubinemia (7 % weight , Bili elevated) Age:21 hours / serum Bili - 12.7/ high intermediate/  Baby awake and fussy.  LC offered to check and change the wet and stool diaper.  LC reviewed the doc flow sheets with mom and updated.  LC offered to assist with latch and give pointers for a deeper latch due to some sore nipples . LC assessed both nipples - no break down, breast are warm, and easily hand expressed. Per  Mom feels comfortable with hand expressing,  spoon feeding and latching.  Baby opens wide and latches well in the the cross cradle. Per mom comfortable. Baby fed for 20 mins and mom planned to latch on the 2nd breast.  See below for D/C education, and breast feeding tools for D/C.  LC provided the Lactation education resource brochure .  Maternal Data Has patient been taught Hand Expression?: Yes  Feeding Mother's Current Feeding Choice: Breast Milk  LATCH Score Latch: Grasps breast easily, tongue down, lips flanged, rhythmical sucking.  Audible Swallowing: Spontaneous and intermittent  Type of Nipple: Everted at rest and after stimulation  Comfort (Breast/Nipple): Filling, red/small blisters or bruises, mild/mod discomfort  Hold (Positioning): Assistance needed to correctly position infant at breast and maintain latch.  LATCH Score: 8   Lactation Tools Discussed/Used Tools: Shells;Pump;Flanges Flange Size: 24;27 Breast pump type: Manual Pump Education: Milk Storage;Setup, frequency, and cleaning Reason for Pumping: PRN  Interventions Interventions: Breast feeding basics reviewed;Assisted with latch;Skin to skin;Breast massage;Hand express;Breast compression;Adjust position;Support pillows;Position options;Coconut oil;Shells  Discharge Discharge  Education: Engorgement and breast care;Warning signs for feeding baby Pump: Manual WIC Program: Yes  Consult Status Consult Status: Complete Date: 06/28/20 Follow-up type: In-patient    Anna Vance 06/28/2020, 11:59 AM

## 2020-06-28 NOTE — Discharge Summary (Addendum)
SVD OB Discharge Summary     Patient Name: Anna Vance DOB: 2000/05/15 MRN: [id number]  Date of admission: 06/25/2020 Delivering MD: Rhea Pink B  Date of delivery: 07-09-2020 Type of delivery: SVD  Newborn Data: Sex: Baby Female Circumcision: in pt done.  Live born female  Birth Weight: 6 lb 0.1 oz (2724 g) APGAR: 9, 9  Newborn Delivery   Birth date/time: 09-Jul-2020 00:38:00 Delivery type: Vaginal, Spontaneous      Feeding: breast Infant being discharge to home with mother in stable condition.   Admitting diagnosis: Polyhydramnios [O40.9XX0] Intrauterine pregnancy: [redacted]w[redacted]d     Secondary diagnosis:  Principal Problem:   SVD (3/22) Active Problems:   Polyhydramnios   Normal postpartum course                                Complications: None                                                              Intrapartum Procedures: spontaneous vaginal delivery and GBS prophylaxis Postpartum Procedures: cervical laceration repaired Complications-Operative and Postpartum: cervical laceration  degree perineal laceration Augmentation: AROM, Pitocin and IP Foley   History of Present Illness: Ms. Anna Vance is a 20 y.o. female, G2P2002, who presents at [redacted]w[redacted]d weeks gestation. The patient has been followed at  [facility]  Her pregnancy has been complicated by:  Patient Active Problem List   Diagnosis Date Noted  . Normal postpartum course 06/28/2020  . SVD (3/22) Jul 09, 2020  . Polyhydramnios 05/01/2020  . PTSD (post-traumatic stress disorder) 04/30/2020  . Hx of bipolar disorder 04/30/2020  . Hx of major depression 04/30/2020  . Asthma 04/30/2020    Hospital course:  Induction of Labor With Vaginal Delivery   20 y.o. yo [id number] at [redacted]w[redacted]d was admitted to the hospital 06/25/2020 for induction of labor.  Indication for induction: polyhydramnios .  Patient had an uncomplicated labor course as follows: Membrane  Rupture Time/Date: 4:52 PM ,06/25/2020   Delivery Method:Vaginal, Spontaneous  Episiotomy: None  Lacerations:  Cervical  Details of delivery can be found in separate delivery note.  Patient had a routine postpartum course. Patient is discharged home 06/28/20.  Newborn Data: Birth date:07-09-20  Birth time:12:38 AM  Gender:Female  Living status:Living  Apgars:9 ,9  Weight:2724 g  Postpartum Day # 2 : S/P NSVD due to being admitted on 3/21 for IOL for polyhydramnios AFI 25, progressed with AROM, foley, and pitocin, had SVD over cervical repaired laceration, ebl of , and hgb drop of 12.6-12.2, stable. H/O bipolar, ptsd, anxiety no meds, mood stable, denies SI/HI, practices mindfulness. Pt was tearful over not being informed they were drug testing her baby and CPS is requiring a home visit. Pt was not informed they were testing her infant urine and was not informed of what would happed if she continued to smoke cannabis in pregnancy. Pt appears very supportive and in a healthy relationship with partner, they appear caring, and have great communication skills, pt denies need any help, able to feed her family, have a home to live in. I sympathized with the pt, as I also had never heard of this policy. I promised the mother  we at the hospital and as provider will obtain hospital policy and ensure the public knows this. Patient up ad lib, denies syncope or dizziness. Reports consuming regular diet without issues and denies N/V. Patient reports 0 bowel movement + passing flatus.  Denies issues with urination and reports bleeding is "lighter."  Patient is breastfeeding and reports going well.  Desires mini pill for postpartum contraception.  Pain is being appropriately managed with use of po meds.   Physical exam  Vitals:   06/27/20 0546 06/27/20 1423 06/27/20 2059 06/28/20 0709  BP: 116/81 125/83 112/72 122/77  Pulse: 62 73 68 (!) 57  Resp: 16 19 18 20   Temp: 98.3 F (36.8 C) 98.6 F (37 C) 98 F  (36.7 C) 98.1 F (36.7 C)  TempSrc: Oral Oral Oral Oral  SpO2:  98% 99%   Weight:      Height:       General: alert, cooperative and no distress Lochia: appropriate Uterine Fundus: firm Incision: Healing well with no significant drainage, No significant erythema, Dressing is clean, dry, and intact, honeycomb dressing CDI Perineum: intact DVT Evaluation: No evidence of DVT seen on physical exam. Negative Homan's sign. No cords or calf tenderness. No significant calf/ankle edema.  Labs: Lab Results  Component Value Date   WBC 12.4 (H) July 26, 2020   HGB 12.2 07-26-20   HCT 35.0 (L) July 26, 2020   MCV 93.8 26-Jul-2020   PLT 173 26-Jul-2020   CMP Latest Ref Rng & Units 03/29/2020  Glucose 70 - 99 mg/dL 84  BUN 6 - 20 mg/dL 6  Creatinine 03/31/2020 - 4.65 mg/dL 0.35  Sodium 4.65 - 681 mmol/L 136  Potassium 3.5 - 5.1 mmol/L 3.3(L)  Chloride 98 - 111 mmol/L 107  CO2 22 - 32 mmol/L 18(L)  Calcium 8.9 - 10.3 mg/dL 275)  Total Protein 6.5 - 8.1 g/dL 6.8  Total Bilirubin 0.3 - 1.2 mg/dL 1.1  Alkaline Phos 38 - 126 U/L 50  AST 15 - 41 U/L 25  ALT 0 - 44 U/L 18    Date of discharge: 06/28/2020 Discharge Diagnoses: Term Pregnancy-delivered Discharge instruction: per After Visit Summary and "Baby and Me Booklet".  After visit meds:   Activity:           unrestricted and pelvic rest Advance as tolerated. Pelvic rest for 6 weeks.  Diet:                routine Medications: PNV and Ibuprofen Postpartum contraception: Progesterone only pills Condition:  Pt discharge to home with baby in stable and condition Bipolar/PTSD/PDD: Mood check in 2 weeks with [facility]. Report SI/HI.   Meds: Allergies as of 06/28/2020      Reactions   Chocolate Hives   Peanut-containing Drug Products Hives   Penicillins Other (See Comments)   Unknown. As a child   Pineapple Swelling      Medication List    STOP taking these medications   cyclobenzaprine 10 MG tablet Commonly known as: FLEXERIL    ondansetron 4 MG disintegrating tablet Commonly known as: Zofran ODT     TAKE these medications   albuterol 108 (90 Base) MCG/ACT inhaler Commonly known as: VENTOLIN HFA Inhale 2 puffs into the lungs every 6 (six) hours as needed for wheezing or shortness of breath.   EPINEPHrine 0.3 mg/0.3 mL Soaj injection Commonly known as: EPI-PEN Inject 0.3 mg into the muscle as needed for anaphylaxis.   ibuprofen 600 MG tablet Commonly known as: ADVIL Take 1 tablet (600  mg total) by mouth every 6 (six) hours.   prenatal multivitamin Tabs tablet Take 1 tablet by mouth daily at 12 noon.   Vitamin D3 25 MCG tablet Commonly known as: Vitamin D Take 4 tablets (4,000 Units total) by mouth daily.       Discharge Follow Up:   Follow-up Information    [facility]. Schedule an appointment as soon as possible for a visit in 2 week(s).   Specialty: Obstetrics and Gynecology Why: 2 week mood check and 6 weeks PPV Contact information: [address] [place] [address] [phone]               [place], NP-C, CNM 06/28/2020, 12:39 PM  [place], FNP

## 2020-07-01 ENCOUNTER — Inpatient Hospital Stay (HOSPITAL_COMMUNITY): Admit: 2020-07-01 | Payer: Self-pay

## 2020-07-06 NOTE — Death Summary Note (Deleted)
Delivery Note Labor onset: 06/25/2020  Labor Onset Time: 1652 Complete dilation at 12:09 AM  Onset of pushing at 0015 FHR second stage Cat 1 Analgesia/Anesthesia intrapartum: Epidural  Guided pushing with maternal urge. Delivery of a viable female at 37. Fetal head delivered in LOA position.  Nuchal cord: none.  Infant placed on maternal abd, dried, and tactile stim.  Cord double clamped after 4 min and cut by Father.  Father present for birth.  Cord blood sample collected: Yes Arterial cord blood sample collected: No  Placenta delivered Tomasa Blase, intact, with 3 VC.  Placenta to L&D. Uterine tone firm, bleeding brisk, no clots  Posterior cervical laceration identified.  Anesthesia: Epidural Repaired w/ 2-0 Monocryl CT in a running lock fashion by Dr. Richardson Dopp  QBL/EBL (mL): 525 Complications: cervical laceration APGAR: APGAR (1 MIN): 9   APGAR (5 MINS): 9   APGAR (10 MINS):   Mom to postpartum.  Baby to Couplet care / Skin to Skin.  Roma Schanz MSN, CNM 07/13/20, 1:50 AM

## 2020-07-06 DEATH — deceased

## 2020-09-25 ENCOUNTER — Other Ambulatory Visit: Payer: Self-pay

## 2020-09-25 ENCOUNTER — Ambulatory Visit (HOSPITAL_COMMUNITY): Payer: No Typology Code available for payment source | Admitting: Clinical

## 2020-10-24 ENCOUNTER — Other Ambulatory Visit: Payer: Self-pay | Admitting: Obstetrics and Gynecology

## 2020-10-29 NOTE — H&P (Addendum)
Anna Vance is a 20 y.o. 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  The patient's initial PAP smear was done 09/04/2020 and returned atypical cells and  high grade squamous intraepithelial lesion could not be excluded.  A follow up colposcopy with biopsy showed high grade squamous intraepithelial lesion (CIN II-III), moderate to severe squamous dysplasia/carcinoma in situ in cervical transformation mucosa zone; HPV changes  were also noted.  The patient denies any abnormal vaginal bleeding, pelvic pain, dyspareunia, changes in bowel or bladder function or vaginitis symptoms.  Her periods are monthly (LMP: 10/13/2020) x 5 days with pad change 4 times a day and cramping that she rates 5/10 but is relieved with Tylenol. Given the pathology of her cervical biopsy the patient would like to proceed with a Loop Electrosurgical Excision Procedure.  Past Medical History  OB History: G:2;  P: 2-0-0-2; SVB: 2017 and 2022  GYN History: menarche : 20 YO;   LMP :  10/13/2020;   Contraception : Depo Provera  Medical History: Asthma, Bipolar Disorder, PTSD, Vitamin D Deficiency and Shortened Cervix in Pregnancy  Surgical History: 2021 Cerclage Denies problems with anesthesia or history of blood transfusions  Family History: Hypertension, Migraine, Hypercholesterolemia and Renal Disease  Social History: Single and employed as a Leisure centre manager; Denies tobacco use and occasionally consumes alcohol and marijuana   Medications:  Depo Provera 150 mg IM every 12 weeks Prenatal Vitamins Vitamin D 4000 units daily  Allergies  Allergen Reactions   Chocolate Hives   Peanut-Containing Drug Products Hives   Penicillins Other (See Comments)    Unknown. As a child   Pineapple Swelling    Denies sensitivity to shellfish, soy, latex or adhesives.   ROS: Admits to occasional episodes of vertigo but  denies headache, vision changes, nasal congestion,  dysphagia, tinnitus, dizziness, hoarseness, cough,  chest pain, shortness of breath, nausea, vomiting, diarrhea,constipation,  urinary frequency, urgency  dysuria, hematuria, vaginitis symptoms, pelvic pain, swelling of joints,easy bruising,  myalgias, arthralgias, skin rashes, unexplained weight loss and except as is mentioned in the history of present illness, patient's review of systems is otherwise negative.    Physical Exam  Bp: 100/62;  P: 60 bpm; Weight: 131 lbs.;  Height: 5'1";  BMI: 24.8  Neck: supple without masses or thyromegaly Lungs: clear to auscultation Heart: regular rate and rhythm Abdomen: soft, non-tender and no organomegaly Pelvic:EGBUS- wnl; vagina-normal rugae; uterus-normal size, cervix without lesions or motion tenderness; adnexae-no tenderness or masses Extremities:  no clubbing, cyanosis or edema   Assesment: Cervical Intraepithelial Neoplasia  II-III                         Disposition:  A discussion was held with patient regarding the indication for her procedure(s) along with the risks, which include but are not limited to: reaction to anesthesia, damage to adjacent organs, infection, shortening of her cervical length that may require a cerclage during pregnancy and excessive bleeding. The patient verbalized understanding of these risks and has consented to proceed with a Loop Electrosurgical Excision Procedure on October 31, 2020 at [facility].   CSN# [id number]   Elmira J. Lowell Guitar, PA-C  for Dr. Woodroe Mode. Donat Humble  Pt did not keep appt for LEEP because she was upset because HPV was on her office problem list.  She left a message stating she was changing practices for this reason.

## 2020-10-30 ENCOUNTER — Encounter (HOSPITAL_COMMUNITY): Payer: Self-pay | Admitting: Obstetrics and Gynecology

## 2020-10-30 ENCOUNTER — Encounter (HOSPITAL_COMMUNITY): Payer: Self-pay | Admitting: Certified Registered Nurse Anesthetist

## 2020-10-30 NOTE — Progress Notes (Signed)
DUE TO COVID-19 ONLY ONE VISITOR IS ALLOWED TO COME WITH YOU AND STAY IN THE WAITING ROOM ONLY DURING PRE OP AND PROCEDURE DAY OF SURGERY.   PCP - None Cardiologist - n/a  Chest x-ray - n/a EKG - n/a Stress Test - n/a ECHO - n/a Cardiac Cath - n/a  Sleep Study -  n/a CPAP - none  ERAS: Clear liquids til 11:30 am DOS  STOP now taking any Aspirin (unless otherwise instructed by your surgeon), Aleve, Naproxen, Ibuprofen, Motrin, Advil, Goody's, BC's, all herbal medications, fish oil, and all vitamins.   Coronavirus Screening Covid test n/a - Ambulatory Surgery  Do you have any of the following symptoms:  Cough yes/no: No Fever (>100.63F)  yes/no: No Runny nose yes/no: No Sore throat yes/no: No Difficulty breathing/shortness of breath  yes/no: No  Have you traveled in the last 14 days and where? yes/no: No  Patient verbalized understanding of instructions that were given via phone.

## 2020-10-31 ENCOUNTER — Ambulatory Visit (HOSPITAL_COMMUNITY)
Admission: RE | Admit: 2020-10-31 | Payer: Medicaid Other | Source: Home / Self Care | Admitting: Obstetrics and Gynecology

## 2020-10-31 HISTORY — DX: Anemia, unspecified: D64.9

## 2020-10-31 SURGERY — LEEP (LOOP ELECTROSURGICAL EXCISION PROCEDURE)
Anesthesia: Choice

## 2020-10-31 MED ORDER — PROPOFOL 10 MG/ML IV BOLUS
INTRAVENOUS | Status: AC
  Filled 2020-10-31: qty 20

## 2020-10-31 MED ORDER — FENTANYL CITRATE (PF) 250 MCG/5ML IJ SOLN
INTRAMUSCULAR | Status: AC
  Filled 2020-10-31: qty 5

## 2020-10-31 MED ORDER — MIDAZOLAM HCL 2 MG/2ML IJ SOLN
INTRAMUSCULAR | Status: AC
  Filled 2020-10-31: qty 2

## 2021-01-02 ENCOUNTER — Encounter (HOSPITAL_COMMUNITY): Payer: Self-pay

## 2021-01-02 ENCOUNTER — Other Ambulatory Visit: Payer: Self-pay

## 2021-01-02 ENCOUNTER — Inpatient Hospital Stay (HOSPITAL_COMMUNITY)
Admission: EM | Admit: 2021-01-02 | Discharge: 2021-01-02 | Disposition: A | Discharge status: Home / Self Care | Payer: Medicaid Other | Attending: Obstetrics & Gynecology | Admitting: Obstetrics & Gynecology

## 2021-01-02 DIAGNOSIS — Z880 Allergy status to penicillin: Secondary | ICD-10-CM | POA: Diagnosis not present

## 2021-01-02 DIAGNOSIS — O210 Mild hyperemesis gravidarum: Secondary | ICD-10-CM | POA: Insufficient documentation

## 2021-01-02 DIAGNOSIS — Z3A01 Less than 8 weeks gestation of pregnancy: Secondary | ICD-10-CM | POA: Insufficient documentation

## 2021-01-02 DIAGNOSIS — O219 Vomiting of pregnancy, unspecified: Secondary | ICD-10-CM

## 2021-01-02 HISTORY — DX: Urinary tract infection, site not specified: N39.0

## 2021-01-02 HISTORY — DX: Unspecified abnormal cytological findings in specimens from vagina: R87.629

## 2021-01-02 LAB — URINALYSIS, ROUTINE W REFLEX MICROSCOPIC
Bacteria, UA: NONE SEEN
Bilirubin Urine: NEGATIVE
Glucose, UA: NEGATIVE mg/dL
Hgb urine dipstick: NEGATIVE
Ketones, ur: 80 mg/dL — AB
Leukocytes,Ua: NEGATIVE
Nitrite: NEGATIVE
Protein, ur: 30 mg/dL — AB
Specific Gravity, Urine: 1.026 (ref 1.005–1.030)
pH: 7 (ref 5.0–8.0)

## 2021-01-02 LAB — I-STAT BETA HCG BLOOD, ED (MC, WL, AP ONLY): I-stat hCG, quantitative: >2000 m[IU]/mL — ABNORMAL HIGH (ref <5)

## 2021-01-02 MED ORDER — ACETAMINOPHEN 500 MG PO TABS
1000.0000 mg | ORAL_TABLET | Freq: Once | ORAL | Status: AC
  Administered 2021-01-02: 1000 mg via ORAL
  Filled 2021-01-02: qty 2

## 2021-01-02 MED ORDER — ONDANSETRON 8 MG PO TBDP: 8.0000 mg | ORAL_TABLET | Freq: Three times a day (TID) | ORAL | 0 refills | Status: AC | PRN

## 2021-01-02 MED ORDER — PROMETHAZINE HCL 25 MG PO TABS: 25.0000 mg | ORAL_TABLET | Freq: Four times a day (QID) | ORAL | 0 refills | Status: AC | PRN

## 2021-01-02 MED ORDER — ONDANSETRON 4 MG PO TBDP
8.0000 mg | ORAL_TABLET | Freq: Once | ORAL | Status: AC
  Administered 2021-01-02: 8 mg via ORAL
  Filled 2021-01-02: qty 2

## 2021-01-02 NOTE — Discharge Instructions (Signed)
Prenatal Care Providers           [facility]  [address] [phone]  [facility]   [address]  [phone]  [facility] @ [place]       [address] [phone]            [facility] @ [place]     [address] [phone]          [facility] @ [place]   [address] [phone]  [facility]  [address] [phone]     [facility]  [address]   [phone]     [facility]  Phone: [phone]  [facility]  Phone: [phone]  [facility] Phone: [phone]  [facility] Phone: [phone]  [facility] Phone: [phone]  [facility] Phone: [phone]  [facility]  Phone: [phone] Safe Medications in Pregnancy   Acne: Benzoyl Peroxide Salicylic Acid  Backache/Headache: Tylenol: 2 regular strength every 4 hours OR              2 Extra strength every 6 hours  Colds/Coughs/Allergies: Benadryl (alcohol free) 25 mg every 6 hours as needed Breath right strips Claritin Cepacol throat lozenges Chloraseptic throat spray Cold-Eeze- up to three times per day Cough drops, alcohol free Flonase (by prescription only) Guaifenesin Mucinex Robitussin DM (plain only, alcohol free) Saline nasal spray/drops Sudafed (pseudoephedrine) & Actifed ** use only after [redacted] weeks gestation and if you do not have high blood pressure Tylenol Vicks Vaporub Zinc lozenges Zyrtec   Constipation: Colace Ducolax suppositories Fleet enema Glycerin suppositories Metamucil Milk of magnesia Miralax Senokot Smooth move tea  Diarrhea: Kaopectate Imodium A-D  *NO pepto  Bismol  Hemorrhoids: Anusol Anusol HC Preparation H Tucks  Indigestion: Tums Maalox Mylanta Zantac  Pepcid  Insomnia: Benadryl (alcohol free) 25mg every 6 hours as needed Tylenol PM Unisom, no Gelcaps  Leg Cramps: Tums MagGel  Nausea/Vomiting:  Bonine Dramamine Emetrol Ginger extract Sea bands Meclizine  Nausea medication to take during pregnancy:  Unisom (doxylamine succinate 25 mg tablets) Take one tablet daily at bedtime. If symptoms are not adequately controlled, the dose can be increased to a maximum recommended dose of two tablets daily (1/2 tablet in the morning, 1/2 tablet mid-afternoon and one at bedtime). Vitamin B6 100mg tablets. Take one tablet twice a day (up to 200 mg per day).  Skin Rashes: Aveeno products Benadryl cream or 25mg every 6 hours as needed Calamine Lotion 1% cortisone cream  Yeast infection: Gyne-lotrimin 7 Monistat 7   **If taking multiple medications, please check labels to avoid duplicating the same active ingredients **take medication as directed on the label ** Do not exceed 4000 mg of tylenol in 24 hours **Do not take medications that contain aspirin or ibuprofen    

## 2021-01-02 NOTE — MAU Provider Note (Signed)
History     CSN: [id number]  Arrival date and time: 01/02/21 1558   Event Date/Time   First Provider Initiated Contact with Patient 01/02/21 1855      Chief Complaint  Patient presents with   Nausea   Emesis   Possible Pregnancy   HPI Anna Vance is a 20 y.o. G3P2002 at [redacted]w[redacted]d who presents with nausea and vomiting from the [facility]. She states she went out last night and drank a bunch of alcohol. This morning she woke up with a headache, nausea and vomiting so she took a pregnancy test which was positive. She reports she still has the same symptoms and has not been able to keep anything down today. She reports her headache feels achy like "when you haven't eaten all day." She denies any abdominal pain, vaginal bleeding or discharge.   OB History     Gravida  3   Para  2   Term  2   Preterm      AB      Living  2      SAB      IAB      Ectopic      Multiple  0   Live Births  2        Obstetric Comments  Short cervix, had cerclage         Past Medical History:  Diagnosis Date   Anemia    hx in past - no current problems   Asthma    as a child   Cannabis abuse 09/14/2018   last use was 10/26/20 approx   Depression    bipolar 1, ptsd   ETOH abuse 09/14/2018   social wine   PCOS (polycystic ovarian syndrome)    Vertigo     Past Surgical History:  Procedure Laterality Date   CERVICAL CERCLAGE N/A 02/24/2020   Procedure: CERCLAGE CERVICAL;  Surgeon: Osborn Coho, MD;  Location: [facility]S;  Service: Gynecology;  Laterality: N/A;    Family History  Problem Relation Age of Onset   Renal Disease Mother    Bipolar disorder Mother    Hypertension Father    Diabetes Father    High Cholesterol Father     Social History   Tobacco Use   Smoking status: Never   Smokeless tobacco: Never  Vaping Use   Vaping Use: Never used  Substance Use Topics   Alcohol use: Yes    Comment: social wine   Drug use: Yes    Types: Marijuana     Comment: 10/26/20 last use approx    Allergies:  Allergies  Allergen Reactions   Peanut-Containing Drug Products Anaphylaxis   Chocolate Hives   Penicillins Other (See Comments)    Unknown. As a child   Pineapple Swelling    Medications Prior to Admission  Medication Sig Dispense Refill Last Dose   acetaminophen (TYLENOL) 500 MG tablet Take 1,000 mg by mouth every 6 (six) hours as needed.      albuterol (VENTOLIN HFA) 108 (90 Base) MCG/ACT inhaler Inhale 2 puffs into the lungs every 6 (six) hours as needed for wheezing or shortness of breath.      BORIC ACID VAGINAL VA Place 1 suppository vaginally at bedtime as needed (bacterial vaginosis symptoms).      cholecalciferol (VITAMIN D) 25 MCG tablet Take 4 tablets (4,000 Units total) by mouth daily. (Patient taking differently: Take 1,000 Units by mouth in the morning, at noon, in the evening, and  at bedtime.) 30 tablet 0    EPINEPHrine 0.3 mg/0.3 mL IJ SOAJ injection Inject 0.3 mg into the muscle as needed for anaphylaxis.       ibuprofen (ADVIL) 600 MG tablet Take 1 tablet (600 mg total) by mouth every 6 (six) hours. (Patient not taking: Reported on 10/24/2020) 30 tablet 0    Prenatal Vit-Fe Fumarate-FA (PRENATAL MULTIVITAMIN) TABS tablet Take 1 tablet by mouth daily with breakfast.       Review of Systems  Constitutional: Negative.  Negative for fatigue and fever.  HENT: Negative.    Respiratory: Negative.  Negative for shortness of breath.   Cardiovascular: Negative.  Negative for chest pain.  Gastrointestinal:  Positive for nausea and vomiting. Negative for abdominal pain, constipation and diarrhea.  Genitourinary: Negative.  Negative for dysuria.  Neurological:  Positive for headaches. Negative for dizziness.  Physical Exam   Blood pressure 125/70, pulse 74, temperature 98.7 F (37.1 C), temperature source Oral, resp. rate 18, height 5\' 1"  (1.549 m), weight 57.7 kg, last menstrual period 11/09/2020, SpO2 100 %, not currently  breastfeeding.  Physical Exam Vitals and nursing note reviewed.  Constitutional:      General: She is not in acute distress.    Appearance: She is well-developed.  HENT:     Head: Normocephalic.  Eyes:     Pupils: Pupils are equal, round, and reactive to light.  Cardiovascular:     Rate and Rhythm: Normal rate and regular rhythm.     Heart sounds: Normal heart sounds.  Pulmonary:     Effort: Pulmonary effort is normal. No respiratory distress.     Breath sounds: Normal breath sounds.  Abdominal:     General: Bowel sounds are normal. There is no distension.     Palpations: Abdomen is soft.     Tenderness: There is no abdominal tenderness.  Skin:    General: Skin is warm and dry.  Neurological:     Mental Status: She is alert and oriented to person, place, and time.  Psychiatric:        Mood and Affect: Mood normal.        Behavior: Behavior normal.        Thought Content: Thought content normal.        Judgment: Judgment normal.    MAU Course  Procedures Results for orders placed or performed during the hospital encounter of 01/02/21 (from the past 24 hour(s))  I-Stat Beta hCG blood, ED ([facility], [facility], [facility] only)     Status: Abnormal   Collection Time: 01/02/21  4:14 PM  Result Value Ref Range   I-stat hCG, quantitative >2,000.0 (H) <5 mIU/mL   Comment 3          Urinalysis, Routine w reflex microscopic     Status: Abnormal   Collection Time: 01/02/21  5:50 PM  Result Value Ref Range   Color, Urine YELLOW YELLOW   APPearance HAZY (A) CLEAR   Specific Gravity, Urine 1.026 1.005 - 1.030   pH 7.0 5.0 - 8.0   Glucose, UA NEGATIVE NEGATIVE mg/dL   Hgb urine dipstick NEGATIVE NEGATIVE   Bilirubin Urine NEGATIVE NEGATIVE   Ketones, ur 80 (A) NEGATIVE mg/dL   Protein, ur 30 (A) NEGATIVE mg/dL   Nitrite NEGATIVE NEGATIVE   Leukocytes,Ua NEGATIVE NEGATIVE   RBC / HPF 0-5 0 - 5 RBC/hpf   WBC, UA 0-5 0 - 5 WBC/hpf   Bacteria, UA NONE SEEN NONE SEEN   Squamous Epithelial / LPF 11-20  0 - 5   Mucus PRESENT     MDM UA Stat HCG from [facility] Discussed PO meds vs. IV hydration- patient desires to do PO meds Zofran PO Tylenol PO   Patient able to tolerate PO fluids.  Encouraged cessation of alcohol use  Assessment and Plan   1. Nausea/vomiting in pregnancy   2. [redacted] weeks gestation of pregnancy    -Discharge home in stable condition -Rx for zofran and phenergan sent to patient's pharmacy -First trimester precautions discussed -Patient advised to follow-up with OB to establish prenatal care -Patient may return to MAU as needed or if her condition were to change or worsen   Rolm Bookbinder CNM 01/02/2021, 6:55 PM

## 2021-01-02 NOTE — MAU Note (Addendum)
N/v/d started this morning.  Had a bout of nausea and dizziness last wk.  +HPT this morning.  Hasn't been able to keep anything down. Has a bad headache.

## 2021-01-02 NOTE — ED Triage Notes (Signed)
Pt arrived by EMS from home. Complaining of N/V that started at 9am Pt also c/o headache  Pt took at home pregnancy test that was positive  Last period August 5th   G3P2  Last live birth march 2022

## 2021-01-02 NOTE — ED Provider Notes (Signed)
Emergency Medicine Provider OB Triage Evaluation Note  Anna Vance Anna Vance is a 20 y.o. female, G2P2002, at Unknown gestation who presents to the emergency department with complaints of nausea and emesis that began this AM. She went drinking last night - decided to check a pregnancy test this AM and it was positive. LNMP 08/08. No abdominal pain.   Review of  Systems  Positive: + nausea, vomiting Negative: - abdominal pain  Physical Exam  BP 109/61 (BP Location: Left Arm)   Pulse 63   Temp 98.7 F (37.1 C) (Oral)   Resp 16   Ht 5' (1.524 m)   LMP 11/09/2020 (Exact Date)   SpO2 100%   Breastfeeding No   BMI 25.78 kg/m  General: Awake, no distress  HEENT: Atraumatic  Resp: Normal effort  Cardiac: Normal rate Abd: Nondistended, nontender  MSK: Moves all extremities without difficulty Neuro: Speech clear  Medical Decision Making  Pt evaluated for pregnancy concern and is stable for transfer to MAU. Pt is in agreement with plan for transfer.  4:48 PM Discussed with MAU APP, Santina Evans, who accepts patient in transfer.  Clinical Impression  No diagnosis found.     Tanda Rockers, PA-C 01/02/21 1649    Bethann Berkshire, MD 01/02/21 2142

## 2021-06-17 ENCOUNTER — Emergency Department (HOSPITAL_COMMUNITY)
Admission: EM | Admit: 2021-06-17 | Discharge: 2021-06-17 | Disposition: A | Discharge status: Home / Self Care | Payer: Medicaid Other | Attending: Emergency Medicine | Admitting: Emergency Medicine

## 2021-06-17 ENCOUNTER — Emergency Department (HOSPITAL_COMMUNITY): Payer: Medicaid Other

## 2021-06-17 ENCOUNTER — Encounter (HOSPITAL_COMMUNITY): Payer: Self-pay | Admitting: Emergency Medicine

## 2021-06-17 ENCOUNTER — Other Ambulatory Visit: Payer: Self-pay

## 2021-06-17 DIAGNOSIS — R350 Frequency of micturition: Secondary | ICD-10-CM | POA: Insufficient documentation

## 2021-06-17 DIAGNOSIS — R7989 Other specified abnormal findings of blood chemistry: Secondary | ICD-10-CM | POA: Diagnosis not present

## 2021-06-17 DIAGNOSIS — S3992XA Unspecified injury of lower back, initial encounter: Secondary | ICD-10-CM | POA: Diagnosis present

## 2021-06-17 DIAGNOSIS — S39012A Strain of muscle, fascia and tendon of lower back, initial encounter: Secondary | ICD-10-CM | POA: Diagnosis not present

## 2021-06-17 DIAGNOSIS — Z91010 Allergy to peanuts: Secondary | ICD-10-CM | POA: Diagnosis not present

## 2021-06-17 DIAGNOSIS — R300 Dysuria: Secondary | ICD-10-CM | POA: Diagnosis not present

## 2021-06-17 DIAGNOSIS — X58XXXA Exposure to other specified factors, initial encounter: Secondary | ICD-10-CM | POA: Insufficient documentation

## 2021-06-17 DIAGNOSIS — N939 Abnormal uterine and vaginal bleeding, unspecified: Secondary | ICD-10-CM | POA: Insufficient documentation

## 2021-06-17 DIAGNOSIS — R102 Pelvic and perineal pain: Secondary | ICD-10-CM

## 2021-06-17 LAB — COMPREHENSIVE METABOLIC PANEL
ALT: 15 U/L (ref 0–44)
AST: 20 U/L (ref 15–41)
Albumin: 3.8 g/dL (ref 3.5–5.0)
Alkaline Phosphatase: 45 U/L (ref 38–126)
Anion gap: 6 (ref 5–15)
BUN: 11 mg/dL (ref 6–20)
CO2: 24 mmol/L (ref 22–32)
Calcium: 9.1 mg/dL (ref 8.9–10.3)
Chloride: 110 mmol/L (ref 98–111)
Creatinine, Ser: 0.77 mg/dL (ref 0.44–1.00)
GFR, Estimated: >60 mL/min (ref >60)
Glucose, Bld: 115 mg/dL — ABNORMAL HIGH (ref 70–99)
Potassium: 4.3 mmol/L (ref 3.5–5.1)
Sodium: 140 mmol/L (ref 135–145)
Total Bilirubin: 1.5 mg/dL — ABNORMAL HIGH (ref 0.3–1.2)
Total Protein: 6.3 g/dL — ABNORMAL LOW (ref 6.5–8.1)

## 2021-06-17 LAB — CBC WITH DIFFERENTIAL/PLATELET
Abs Immature Granulocytes: 0 10*3/uL (ref 0.00–0.07)
Basophils Absolute: 0 10*3/uL (ref 0.0–0.1)
Basophils Relative: 1 %
Eosinophils Absolute: 0.4 10*3/uL (ref 0.0–0.5)
Eosinophils Relative: 12 %
HCT: 40.6 % (ref 36.0–46.0)
Hemoglobin: 13 g/dL (ref 12.0–15.0)
Immature Granulocytes: 0 %
Lymphocytes Relative: 36 %
Lymphs Abs: 1.2 10*3/uL (ref 0.7–4.0)
MCH: 30.7 pg (ref 26.0–34.0)
MCHC: 32 g/dL (ref 30.0–36.0)
MCV: 96 fL (ref 80.0–100.0)
Monocytes Absolute: 0.4 10*3/uL (ref 0.1–1.0)
Monocytes Relative: 10 %
Neutro Abs: 1.4 10*3/uL — ABNORMAL LOW (ref 1.7–7.7)
Neutrophils Relative %: 41 %
Platelets: 245 10*3/uL (ref 150–400)
RBC: 4.23 MIL/uL (ref 3.87–5.11)
RDW: 13.6 % (ref 11.5–15.5)
WBC: 3.5 10*3/uL — ABNORMAL LOW (ref 4.0–10.5)
nRBC: 0 % (ref 0.0–0.2)

## 2021-06-17 LAB — URINALYSIS, ROUTINE W REFLEX MICROSCOPIC
Bacteria, UA: NONE SEEN
Bilirubin Urine: NEGATIVE
Glucose, UA: NEGATIVE mg/dL
Ketones, ur: NEGATIVE mg/dL
Leukocytes,Ua: NEGATIVE
Nitrite: NEGATIVE
Protein, ur: NEGATIVE mg/dL
Specific Gravity, Urine: 1.024 (ref 1.005–1.030)
pH: 5 (ref 5.0–8.0)

## 2021-06-17 LAB — WET PREP, GENITAL
Clue Cells Wet Prep HPF POC: NONE SEEN
Trich, Wet Prep: NONE SEEN
WBC, Wet Prep HPF POC: <10 (ref <10)
Yeast Wet Prep HPF POC: NONE SEEN

## 2021-06-17 LAB — PREGNANCY, URINE: Preg Test, Ur: NEGATIVE

## 2021-06-17 MED ORDER — IOHEXOL 300 MG/ML  SOLN
100.0000 mL | Freq: Once | INTRAMUSCULAR | Status: AC | PRN
  Administered 2021-06-17: 100 mL via INTRAVENOUS

## 2021-06-17 MED ORDER — NAPROXEN 500 MG PO TABS: 500.0000 mg | ORAL_TABLET | Freq: Two times a day (BID) | ORAL | 0 refills | Status: AC

## 2021-06-17 MED ORDER — METHOCARBAMOL 500 MG PO TABS: 500.0000 mg | ORAL_TABLET | Freq: Two times a day (BID) | ORAL | 0 refills | Status: AC

## 2021-06-17 NOTE — ED Notes (Signed)
Pt returned from US

## 2021-06-17 NOTE — ED Provider Notes (Incomplete)
°  Physical Exam  BP 112/72 (BP Location: Right Arm)    Pulse 85    Temp 98.7 F (37.1 C) (Oral)    Resp 16    Ht 5\' 2"  (1.575 m)    Wt 59 kg    LMP 11/09/2020 (Exact Date)    SpO2 100%    Breastfeeding Unknown    BMI 23.78 kg/m   Physical Exam  Procedures  Procedures  ED Course / MDM   Clinical Course as of 06/17/21 1534  Mon Jun 17, 2021  1323 Total Bilirubin(!): 1.5 [HK]  1323 Bacteria, UA: NONE SEEN [HK]  1323 Leukocytes,Ua: NEGATIVE [HK]  1323 Clue Cells Wet Prep HPF POC: NONE SEEN [HK]  1530 Pending CT abd pelvis for lower abd pain. Vaginal bleeding since august. Plans to follow-up with OB. Pelvic w/ no active bleeding. No UTI. Musk back pain. Pelvic September negative for torsion. No discharge. NSAID and muscle relaxers already prescribed. [RK]    Clinical Course User Index [HK] [place], PA-C [RK] Dietrich Pates, MD   Medical Decision Making Amount and/or Complexity of Data Reviewed Labs: ordered. Decision-making details documented in ED Course. Radiology: ordered.  Risk Prescription drug management.   ***

## 2021-06-17 NOTE — ED Triage Notes (Signed)
Patient here with complaint of vaginal bleeding since October last year and abdominal pain. Patient states seen for same in December and referred to gynecologist but states she has had difficulty finding one that will accept her medicaid. Patient is alert, oriented, and in no apparent distress at this time. ?

## 2021-06-17 NOTE — ED Provider Notes (Signed)
[facility] EMERGENCY DEPARTMENT Provider Note   CSN: [id number] Arrival date & time: 06/17/21  1044     History  Chief Complaint  Patient presents with   Vaginal Bleeding   Abdominal Pain    Anna Vance is a 21 y.o. female presenting to the ED with a chief complaint of abdominal pain, back pain and vaginal bleeding.  States that she had a miscarriage in October and subsequently received a Depo shot.  Since then she has had bleeding.  It will be intermittently heavy and intermittently light and spotting.  Currently she is having light bleeding but she reports worsening lower abdominal/pelvic pain for the past few days.  She also reports back pain and urinary frequency, dysuria and she is concerned that she may have a UTI.  This feels similar to her prior UTIs.  She has not tried medicine help with her symptoms.  She is not currently followed up with an OB/GYN regarding her bleeding.  She denies any vomiting, diarrhea or fevers.  No chest pain or shortness of breath no prior abdominal surgeries   Vaginal Bleeding Associated symptoms: abdominal pain   Associated symptoms: no dizziness, no dysuria, no fever and no nausea   Abdominal Pain Associated symptoms: vaginal bleeding   Associated symptoms: no chest pain, no chills, no constipation, no cough, no diarrhea, no dysuria, no fever, no hematuria, no nausea, no shortness of breath, no sore throat and no vomiting       Home Medications Prior to Admission medications   Medication Sig Start Date End Date Taking? Authorizing Provider  methocarbamol (ROBAXIN) 500 MG tablet Take 1 tablet (500 mg total) by mouth 2 (two) times daily. 06/17/21  Yes Nahshon Reich, PA-C  naproxen (NAPROSYN) 500 MG tablet Take 1 tablet (500 mg total) by mouth 2 (two) times daily. 06/17/21  Yes Jersi Mcmaster, PA-C  acetaminophen (TYLENOL) 500 MG tablet Take 1,000 mg by mouth every 6 (six) hours as needed.    [provider]   albuterol (VENTOLIN HFA) 108 (90 Base) MCG/ACT inhaler Inhale 2 puffs into the lungs every 6 (six) hours as needed for wheezing or shortness of breath.    [provider]  EPINEPHrine 0.3 mg/0.3 mL IJ SOAJ injection Inject 0.3 mg into the muscle as needed for anaphylaxis.     [provider]  ondansetron (ZOFRAN ODT) 8 MG disintegrating tablet Take 1 tablet (8 mg total) by mouth every 8 (eight) hours as needed for nausea or vomiting. 01/02/21   Rolm Bookbinder, CNM  Prenatal Vit-Fe Fumarate-FA (PRENATAL MULTIVITAMIN) TABS tablet Take 1 tablet by mouth daily with breakfast.    [provider]  promethazine (PHENERGAN) 25 MG tablet Take 1 tablet (25 mg total) by mouth every 6 (six) hours as needed for nausea or vomiting. 01/02/21   Rolm Bookbinder, CNM      Allergies    Peanut-containing drug products, Chocolate, Penicillins, and Pineapple    Review of Systems   Review of Systems  Constitutional:  Negative for appetite change, chills and fever.  HENT:  Negative for ear pain, rhinorrhea, sneezing and sore throat.   Eyes:  Negative for photophobia and visual disturbance.  Respiratory:  Negative for cough, chest tightness, shortness of breath and wheezing.   Cardiovascular:  Negative for chest pain and palpitations.  Gastrointestinal:  Positive for abdominal pain. Negative for blood in stool, constipation, diarrhea, nausea and vomiting.  Genitourinary:  Positive for vaginal bleeding. Negative for dysuria,  hematuria and urgency.  Musculoskeletal:  Negative for myalgias.  Skin:  Negative for rash.  Neurological:  Negative for dizziness, weakness and light-headedness.   Physical Exam Updated Vital Signs BP 112/72 (BP Location: Right Arm)    Pulse 85    Temp 98.7 F (37.1 C) (Oral)    Resp 16    Ht  (1.575 m)    Wt 59 kg    LMP 11/09/2020 (Exact Date)    SpO2 100%    Breastfeeding Unknown    BMI 23.78 kg/m  Physical Exam Vitals and nursing note reviewed. Exam  conducted with a chaperone present.  Constitutional:      General: She is not in acute distress.    Appearance: She is well-developed.  HENT:     Head: Normocephalic and atraumatic.     Nose: Nose normal.  Eyes:     General: No scleral icterus.       Left eye: No discharge.     Conjunctiva/sclera: Conjunctivae normal.  Cardiovascular:     Rate and Rhythm: Normal rate and regular rhythm.     Heart sounds: Normal heart sounds. No murmur heard.   No friction rub. No gallop.  Pulmonary:     Effort: Pulmonary effort is normal. No respiratory distress.     Breath sounds: Normal breath sounds.  Abdominal:     General: Bowel sounds are normal. There is no distension.     Palpations: Abdomen is soft.     Tenderness: There is no abdominal tenderness. There is no guarding.  Genitourinary:    Vagina: No bleeding.     Cervix: No cervical motion tenderness.     Adnexa:        Right: Tenderness present.        Left: Tenderness present.      Comments: Pelvic exam: normal external genitalia without evidence of trauma. VULVA: normal appearing vulva with no masses, tenderness or lesion. VAGINA: normal appearing vagina with normal color and discharge, no lesions. No bleeding noted. CERVIX: normal appearing cervix without lesions, cervical motion tenderness absent, cervical os closed with out purulent discharge; No vaginal discharge. Wet prep and DNA probe for chlamydia and GC obtained.   ADNEXA: normal adnexa in size, nontender and no masses UTERUS: uterus is normal size, shape, consistency and nontender.   Musculoskeletal:        General: Normal range of motion.     Cervical back: Normal range of motion and neck supple.  Skin:    General: Skin is warm and dry.     Findings: No rash.  Neurological:     Mental Status: She is alert.     Motor: No abnormal muscle tone.     Coordination: Coordination normal.    ED Results / Procedures / Treatments   Labs (all labs ordered are listed, but only  abnormal results are displayed) Labs Reviewed  URINALYSIS, ROUTINE W REFLEX MICROSCOPIC - Abnormal; Notable for the following components:      Result Value   APPearance HAZY (*)    Hgb urine dipstick LARGE (*)    All other components within normal limits  COMPREHENSIVE METABOLIC PANEL - Abnormal; Notable for the following components:   Glucose, Bld 115 (*)    Total Protein 6.3 (*)    Total Bilirubin 1.5 (*)    All other components within normal limits  CBC WITH DIFFERENTIAL/PLATELET - Abnormal; Notable for the following components:   WBC 3.5 (*)    Neutro Abs 1.4 (*)  All other components within normal limits  WET PREP, GENITAL  PREGNANCY, URINE  GC/CHLAMYDIA PROBE AMP [facility]    EKG None  Radiology US PELVIC COMPLETE W TRANSVAGINAL AND TORSION R/O  Result Date: 06/17/2021 CLINICAL DATA:  Lower back pain, lower abdominal pain and pelvic pain since September 2022, vaginal bleeding, on Depo shots EXAM: TRANSABDOMINAL AND TRANSVAGINAL ULTRASOUND OF PELVIS DOPPLER ULTRASOUND OF OVARIES TECHNIQUE: Both transabdominal and transvaginal ultrasound examinations of the pelvis were performed. Transabdominal technique was performed for global imaging of the pelvis including uterus, ovaries, adnexal regions, and pelvic cul-de-sac. It was necessary to proceed with endovaginal exam following the transabdominal exam to visualize the uterus, endometrium, and adnexa. Color and duplex Doppler ultrasound was utilized to evaluate blood flow to the ovaries. COMPARISON:  None FINDINGS: Uterus Measurements: 8.7 x 3.6 x 4.6 cm = volume: 77 mL. Anteverted. Normal morphology without mass Endometrium Thickness: 5 mm. Single tiny basilar calcification at mid uterus. No endometrial mass or fluid. Right ovary Measurements: 4.0 x 1.7 x 2.2 cm = volume: 7.7 mL. Normal morphology without mass. Internal blood flow present on color Doppler imaging. Left ovary Measurements: 3.2 x 1.8 x 2.0 cm = volume: 6.0  mL. Normal morphology without mass. Internal blood flow present on color Doppler imaging. Pulsed Doppler evaluation of both ovaries demonstrates normal low-resistance arterial and venous waveforms. Other findings No free pelvic fluid or adnexal masses. IMPRESSION: Normal exam. Electronically Signed   By: Ulyses SouthwardMark  Boles M.D.   On: 06/17/2021 12:28    Procedures Procedures    Medications Ordered in ED Medications - No data to display  ED Course/ Medical Decision Making/ A&P Clinical Course as of 06/17/21 1525  Mon Jun 17, 2021  1323 Total Bilirubin(!): 1.5 [HK]  1323 Bacteria, UA: NONE SEEN [HK]  1323 Leukocytes,Ua: NEGATIVE [HK]  1323 Clue Cells Wet Prep HPF POC: NONE SEEN [HK]    Clinical Course User Index [HK] Dietrich PatesKhatri, Kayvion Arneson, PA-C                           Medical Decision Making Amount and/or Complexity of Data Reviewed Labs: ordered. Decision-making details documented in ED Course. Radiology: ordered.  Risk Prescription drug management.   21 year old female presenting to the ED for vaginal bleeding, abdominal pain, back pain and dysuria.  Regarding her back pain dysuria she is concerned that she may have a UTI as this is how she typically presents.  She has had intermittent vaginal bleeding since October.  She states that it will vary in how heavy the bleeding is.  Currently she is having light bleeding but she states worsening lower abdominal/pelvic pain.  This has been going on for the past few days.  On exam patient with some lower abdominal tenderness without rebound or guarding.  Pelvic exam revealed without blood in the vaginal vault.  There is no cervical motion tenderness.  She does have some tenderness of bilateral pelvic area although this is diffuse.  Urinalysis without evidence of infection.  CBC, CMP unremarkable.  Swabs are pending and will obtain CT of abdomen pelvis due to location of pain. As far as her back pain suspect that symptoms could be musculoskeletal and will  treat as such outpatient.  Care handed off to oncoming provider pending CT of abdomen pelvis    Portions of this note were generated with Dragon dictation software. Dictation errors may occur despite best attempts at proofreading.  Final Clinical Impression(s) / ED Diagnoses Final diagnoses:  Strain of lumbar region, initial encounter    Rx / DC Orders ED Discharge Orders          Ordered    methocarbamol (ROBAXIN) 500 MG tablet  2 times daily        06/17/21 1524    naproxen (NAPROSYN) 500 MG tablet  2 times daily        06/17/21 1524              Dietrich Pates, PA-C 06/17/21 1525    Alvira Monday, MD 06/17/21 2323

## 2021-06-17 NOTE — ED Provider Triage Note (Signed)
Emergency Medicine Provider Triage Evaluation Note ? ?[facility] Anna Vance , a 21 y.o. female  was evaluated in triage.  Pt complains of pelvic pain.  Reports that she has been having vaginal bleeding since October.  She was seen in an in emergency department who did an ultrasound and said that she had PCOS.  Has not been able to get in with an OB/GYN.  Reports that she had a Depo-Provera shot in December however has not had one since.  Says that this has happened in the past after she had an IUD placed after her first child 3 years ago. ? ?Also complains of urinary frequency and back pain ? ?Review of Systems  ?See above ? ?Physical Exam  ?BP 107/73 (BP Location: Right Arm)   Pulse 92   Temp 98.7 ?F (37.1 ?C) (Oral)   Resp 15   LMP 11/09/2020 (Exact Date)   SpO2 100%   Breastfeeding Unknown  ?Gen:   Awake, no distress   ?Resp:  Normal effort  ?MSK:   Moves extremities without difficulty  ?Other:  Dull tenderness in the pelvic area bilaterally ? ?Medical Decision Making  ?Medically screening exam initiated at 11:09 AM.  Appropriate orders placed.  Odie Sera Anna Vance was informed that the remainder of the evaluation will be completed by another provider, this initial triage assessment does not replace that evaluation, and the importance of remaining in the ED until their evaluation is complete. ? ?  ?Saddie Benders, PA-C ?06/17/21 1110 ? ?

## 2021-06-17 NOTE — ED Notes (Signed)
Patient transported to US 

## 2021-06-17 NOTE — Discharge Instructions (Signed)
Take medications to help with your back pain. ?You will need to see an OB/GYN for your vaginal bleeding.  I have referred you to the women's clinic below. ?Return to the ER if you start to experience worsening symptoms, severe abdominal pain, increased bleeding, lightheadedness or chest pain ?

## 2021-06-18 LAB — GC/CHLAMYDIA PROBE AMP (~~LOC~~) NOT AT ARMC
Chlamydia: NEGATIVE
Comment: NEGATIVE
Comment: NORMAL
Neisseria Gonorrhea: NEGATIVE

## 2021-07-14 ENCOUNTER — Inpatient Hospital Stay (HOSPITAL_COMMUNITY)
Admission: AD | Admit: 2021-07-14 | Discharge: 2021-07-14 | Disposition: A | Discharge status: Home / Self Care | Payer: Medicaid Other | Attending: Obstetrics & Gynecology | Admitting: Obstetrics & Gynecology

## 2021-07-14 ENCOUNTER — Other Ambulatory Visit: Payer: Self-pay

## 2021-07-14 DIAGNOSIS — Z3202 Encounter for pregnancy test, result negative: Secondary | ICD-10-CM | POA: Diagnosis not present

## 2021-07-14 DIAGNOSIS — N939 Abnormal uterine and vaginal bleeding, unspecified: Secondary | ICD-10-CM | POA: Diagnosis not present

## 2021-07-14 DIAGNOSIS — O036 Delayed or excessive hemorrhage following complete or unspecified spontaneous abortion: Secondary | ICD-10-CM | POA: Insufficient documentation

## 2021-07-14 LAB — HCG, QUANTITATIVE, PREGNANCY: hCG, Beta Chain, Quant, S: 1 m[IU]/mL (ref <5)

## 2021-07-14 LAB — POCT PREGNANCY, URINE: Preg Test, Ur: NEGATIVE

## 2021-07-14 NOTE — MAU Note (Signed)
Anna Vance Simone Rodenbeck is a 21 y.o. at Unknown here in MAU reporting: VB  that began Friday morning.  States took 3 HPT and two were negative, but the last one was faintly positive.  Endorses abdominal cramping, states began after VB on Friday morning. ?LMP: Unsure, last Depo shot 01/11/2021 ?Onset of complaint: Friday ?Pain score: 8 ?Vitals:  ? 07/14/21 1003  ?BP: 119/70  ?Pulse: 84  ?Resp: 19  ?Temp: 98.7 ?F (37.1 ?C)  ?SpO2: 100%  ?   ?FHT:N/A ?Lab orders placed from triage:   UPT ?

## 2021-07-14 NOTE — MAU Provider Note (Signed)
Event Date/Time  ? First Provider Initiated Contact with Patient 07/14/21 1005   ?  ? ?S ?Ms. Anna Vance is a 21 y.o. [id number] patient who presents to MAU today with complaint of vaginal bleeding. She reports several negative and several positive tests.  ? ?O ?BP 119/70 (BP Location: Right Arm)   Pulse 84   Temp 98.7 ?F (37.1 ?C) (Oral)   Resp 19   Ht 5\' 2"  (1.575 m)   Wt 64.8 kg   SpO2 100%   BMI 26.12 kg/m?  ?Physical Exam ?Vitals and nursing note reviewed.  ?Constitutional:   ?   General: She is not in acute distress. ?   Appearance: She is well-developed.  ?HENT:  ?   Head: Normocephalic.  ?Eyes:  ?   Pupils: Pupils are equal, round, and reactive to light.  ?Cardiovascular:  ?   Rate and Rhythm: Normal rate and regular rhythm.  ?   Heart sounds: Normal heart sounds.  ?Pulmonary:  ?   Effort: Pulmonary effort is normal. No respiratory distress.  ?   Breath sounds: Normal breath sounds.  ?Abdominal:  ?   General: Bowel sounds are normal. There is no distension.  ?   Palpations: Abdomen is soft.  ?   Tenderness: There is no abdominal tenderness.  ?Skin: ?   General: Skin is warm and dry.  ?Neurological:  ?   Mental Status: She is alert and oriented to person, place, and time.  ?Psychiatric:     ?   Mood and Affect: Mood normal.     ?   Behavior: Behavior normal.     ?   Thought Content: Thought content normal.     ?   Judgment: Judgment normal.  ? ?Results for orders placed or performed during the hospital encounter of 07/14/21 (from the past 24 hour(s))  ?Pregnancy, urine POC     Status: None  ? Collection Time: 07/14/21  9:58 AM  ?Result Value Ref Range  ? Preg Test, Ur NEGATIVE NEGATIVE  ?hCG, quantitative, pregnancy     Status: None  ? Collection Time: 07/14/21 10:11 AM  ?Result Value Ref Range  ? hCG, Beta Chain, Quant, S 1 <5 mIU/mL  ?  ?A ?Medical screening exam complete ?1. Negative pregnancy test   ?2. Vaginal bleeding   ? ? ?P ?Discharge from MAU in stable condition ?Patient given  the option of transfer to [facility] for further evaluation or seek care in outpatient facility of choice  ?List of options for follow-up given  ?Warning signs for worsening condition that would warrant emergency follow-up discussed ?Patient may return to MAU as needed  ? ?[facility], CNM ?07/14/2021 10:05 AM  ? ?

## 2021-09-29 ENCOUNTER — Inpatient Hospital Stay (HOSPITAL_COMMUNITY)
Admission: AD | Admit: 2021-09-29 | Discharge: 2021-09-29 | Disposition: A | Discharge status: Home / Self Care | Payer: Medicaid Other | Attending: Obstetrics & Gynecology | Admitting: Obstetrics & Gynecology

## 2021-09-29 ENCOUNTER — Encounter (HOSPITAL_COMMUNITY): Payer: Self-pay | Admitting: Obstetrics & Gynecology

## 2021-09-29 DIAGNOSIS — O039 Complete or unspecified spontaneous abortion without complication: Secondary | ICD-10-CM | POA: Diagnosis present

## 2021-09-29 DIAGNOSIS — Z3A01 Less than 8 weeks gestation of pregnancy: Secondary | ICD-10-CM

## 2021-09-29 LAB — HCG, QUANTITATIVE, PREGNANCY: hCG, Beta Chain, Quant, S: 125 m[IU]/mL — ABNORMAL HIGH (ref <5)

## 2021-10-12 ENCOUNTER — Encounter (HOSPITAL_COMMUNITY): Payer: Self-pay

## 2021-10-12 ENCOUNTER — Emergency Department (HOSPITAL_COMMUNITY)
Admission: EM | Admit: 2021-10-12 | Discharge: 2021-10-12 | Discharge status: Against Medical Advice | Payer: Medicaid Other | Attending: Emergency Medicine | Admitting: Emergency Medicine

## 2021-10-12 ENCOUNTER — Other Ambulatory Visit: Payer: Self-pay

## 2021-10-12 DIAGNOSIS — Z5321 Procedure and treatment not carried out due to patient leaving prior to being seen by health care provider: Secondary | ICD-10-CM | POA: Insufficient documentation

## 2021-10-12 DIAGNOSIS — Y909 Presence of alcohol in blood, level not specified: Secondary | ICD-10-CM | POA: Insufficient documentation

## 2021-10-12 DIAGNOSIS — S51011A Laceration without foreign body of right elbow, initial encounter: Secondary | ICD-10-CM | POA: Diagnosis not present

## 2021-10-12 DIAGNOSIS — W228XXA Striking against or struck by other objects, initial encounter: Secondary | ICD-10-CM | POA: Diagnosis not present

## 2021-10-12 DIAGNOSIS — S61212A Laceration without foreign body of right middle finger without damage to nail, initial encounter: Secondary | ICD-10-CM | POA: Diagnosis not present

## 2021-10-12 DIAGNOSIS — F1010 Alcohol abuse, uncomplicated: Secondary | ICD-10-CM | POA: Insufficient documentation

## 2021-10-12 NOTE — ED Triage Notes (Signed)
BIB [facility] after punching a window. 2 in laceration to R elbow and another on R middle finger. +ETOH

## 2022-03-01 IMAGING — US US OB COMP LESS 14 WK
1 series · 14 of 28 positions shown · non-contrast
Comparison: None.

CLINICAL DATA: 19-year-old pregnant female with pelvic pain. LMP:
09/25/2019 corresponding to an estimated gestational age of 6 weeks,
1 day.

EXAM:
OBSTETRIC <14 WK US AND TRANSVAGINAL OB US
TECHNIQUE: Both transabdominal and transvaginal ultrasound examinations were
performed for complete evaluation of the gestation as well as the
maternal uterus, adnexal regions, and pelvic cul-de-sac.
Transvaginal technique was performed to assess early pregnancy.

[Series 1: us ob comp less 14 wk · 107 acquisitions, 14 frames shown]
[im 4/107]
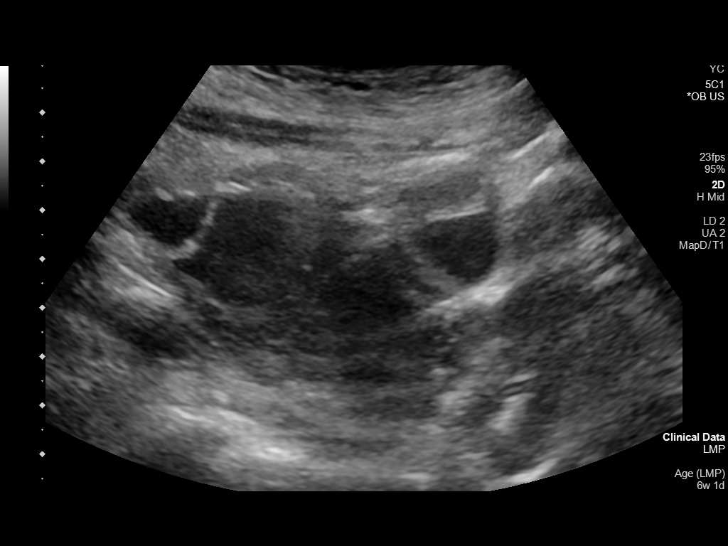
[im 12/107]
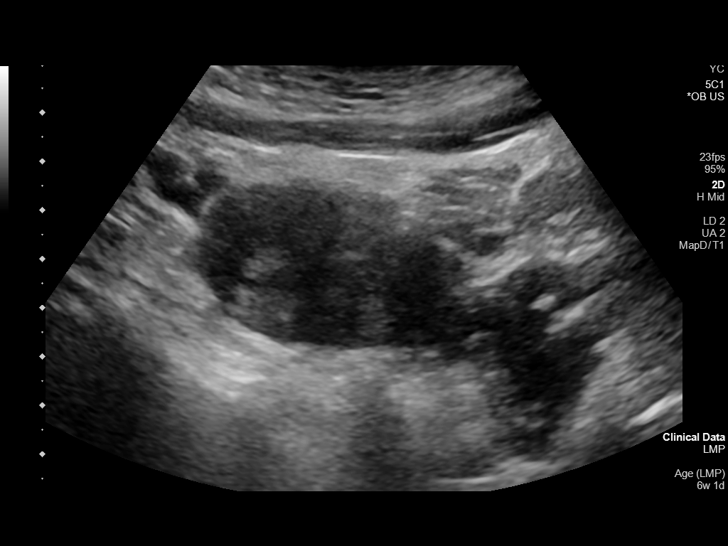
[im 20/107]
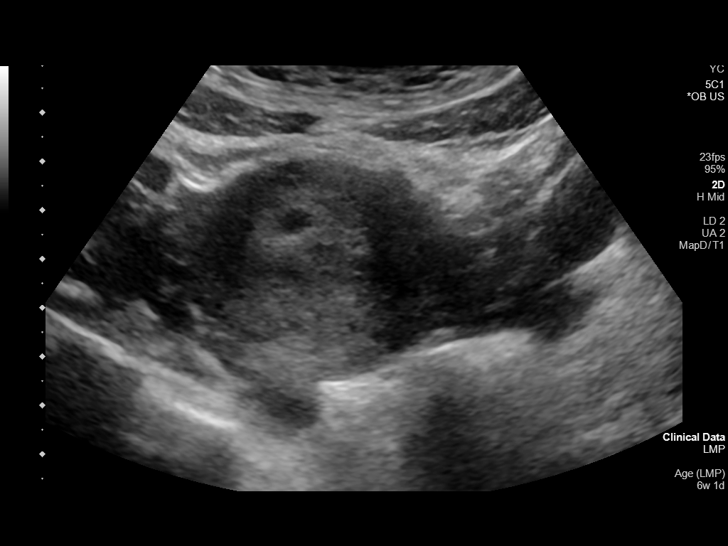
[im 28/107]
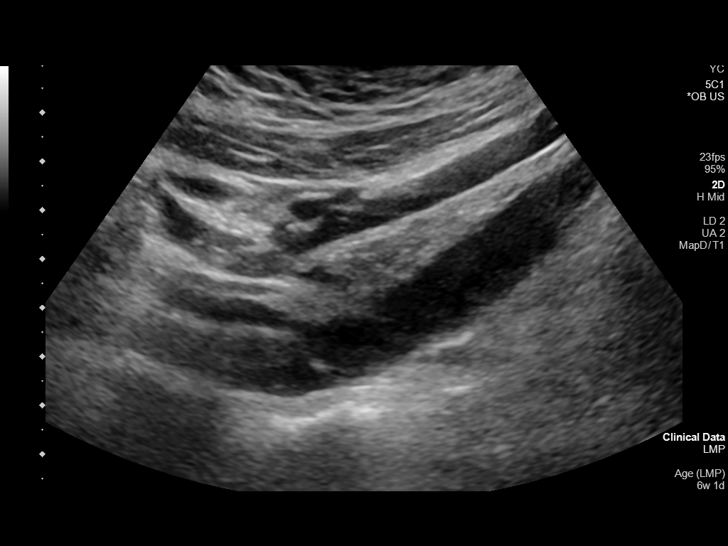
[im 36/107]
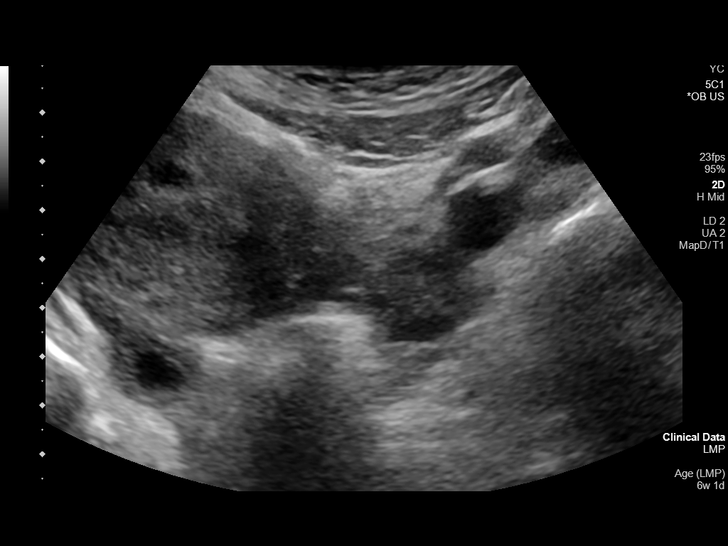
[im 44/107]
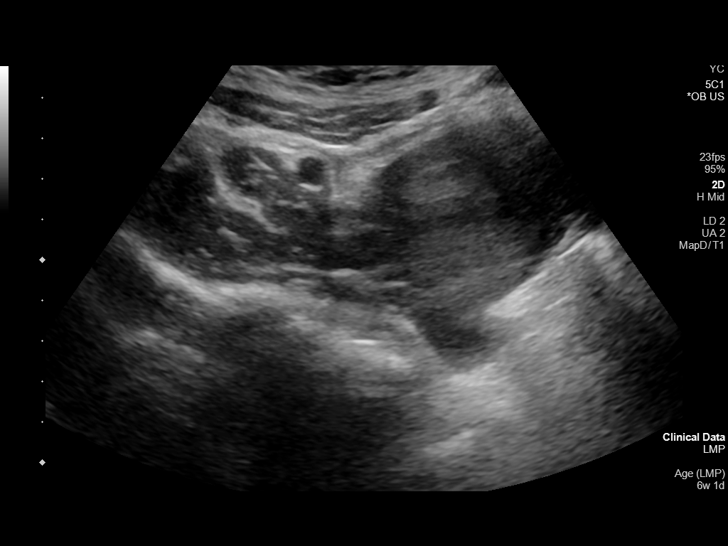
[im 52/107]
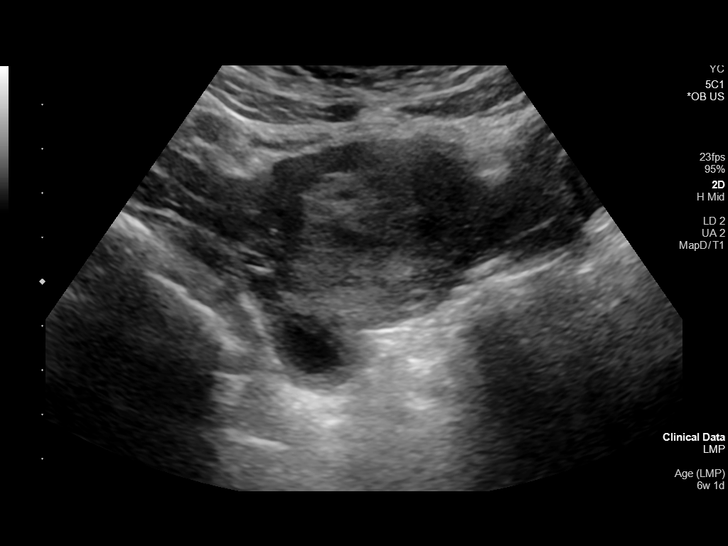
[im 59/107]
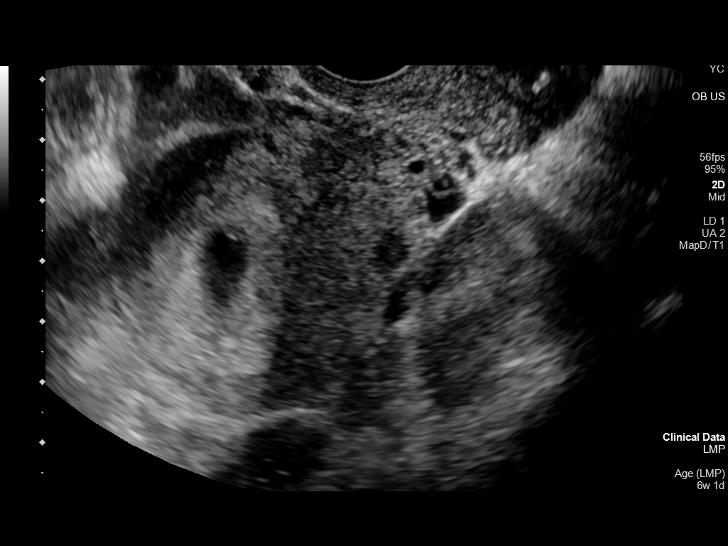
[im 67/107]
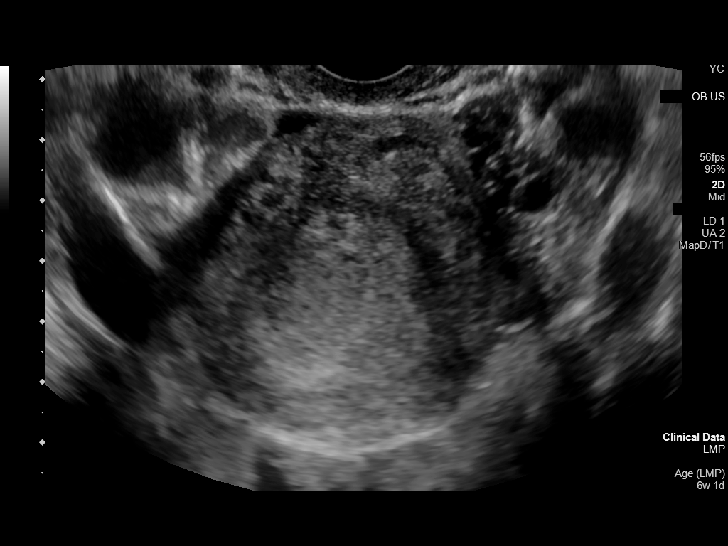
[im 75/107]
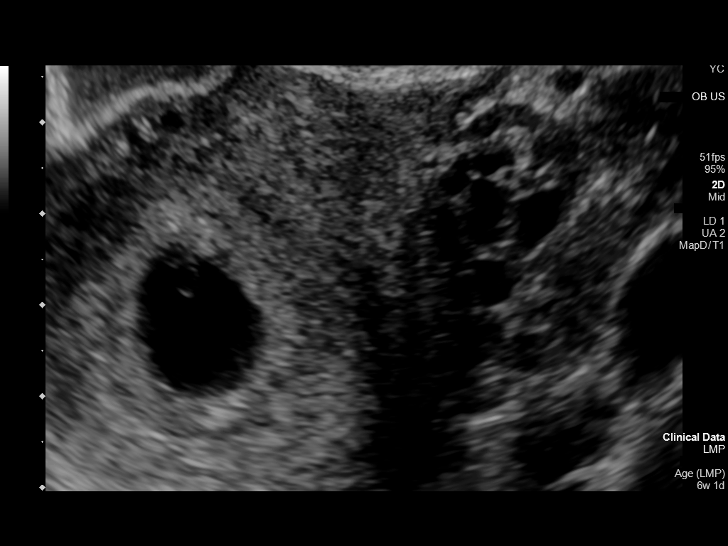
[im 83/107]
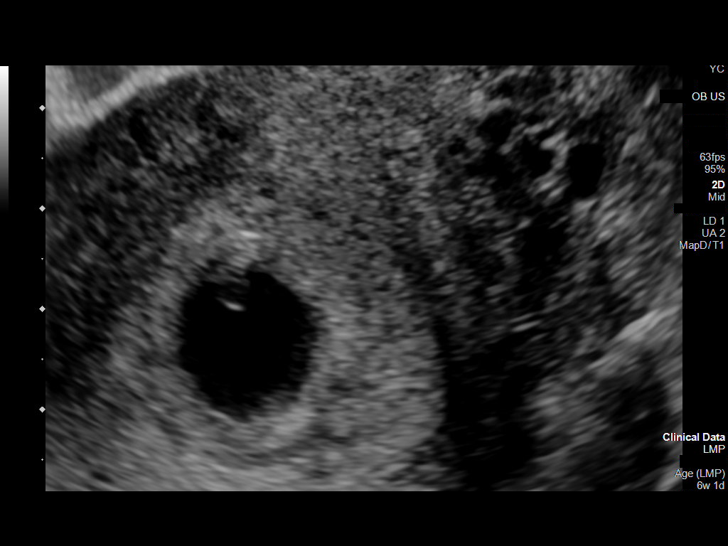
[im 91/107]
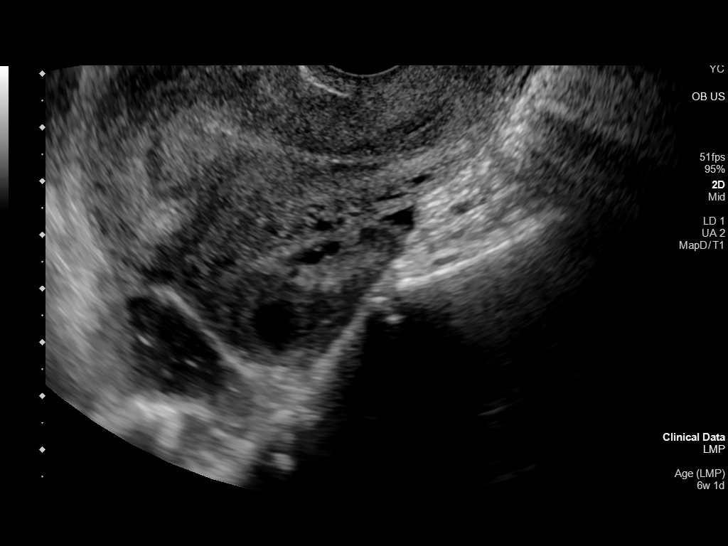
[im 99/107]
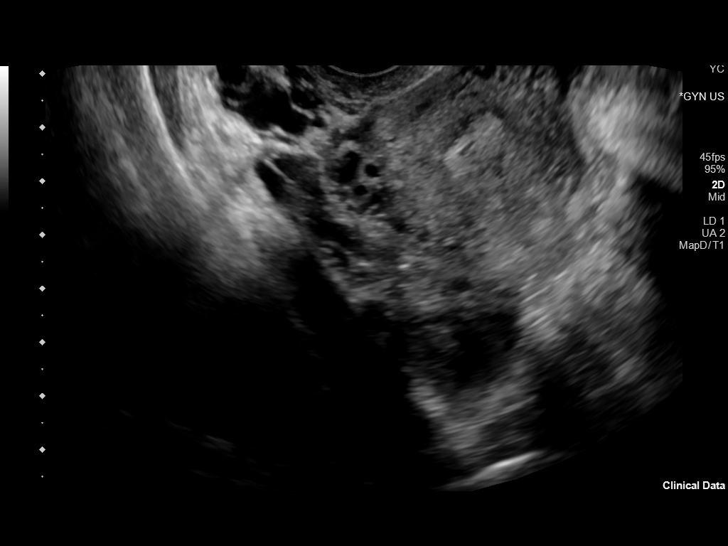
[im 107/107]
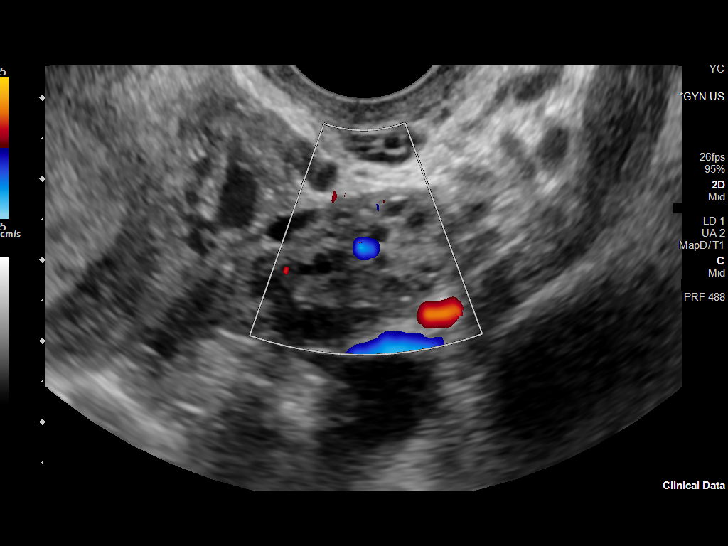

[14 of 28 positions shown; findings below may reference images not displayed]

FINDINGS: Intrauterine gestational sac: Single intrauterine gestational sac.

Yolk sac:  Seen

Embryo:  Present

Cardiac Activity: Detected

Heart Rate: 108 bpm

CRL: 5 mm   6 w   2 d                  US EDC: 06/30/2020

Subchorionic hemorrhage:  Small subchorionic hemorrhage.

Maternal uterus/adnexae: The maternal ovaries are unremarkable.
There is a corpus luteum in the right ovary.

Small free fluid in the pelvis.
IMPRESSION: Single live intrauterine pregnancy with an estimated gestational age
of 6 weeks, 2 days.

## 2023-01-06 DEATH — deceased

## 2023-10-10 IMAGING — CT CT ABD-PELV W/ CM
2 of 4 series · 16 of 46 positions shown, 18 images · IV contrast (Omni 300)
Comparison: None.

CLINICAL DATA: Left lower quadrant abdominal pain

EXAM:
CT ABDOMEN AND PELVIS WITH CONTRAST
TECHNIQUE: Multidetector CT imaging of the abdomen and pelvis was performed
using the standard protocol following bolus administration of
intravenous contrast.

[Series 3: a/p w/ 5mm · axial · 0.65mm/px · z∈[-1298,-923]mm · 13 of 83 slices shown, 15 images]
[im 4/83  soft-tissue]
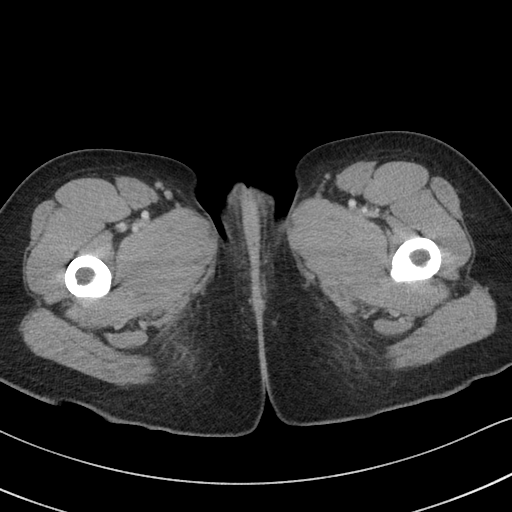
[im 4/83  bone]
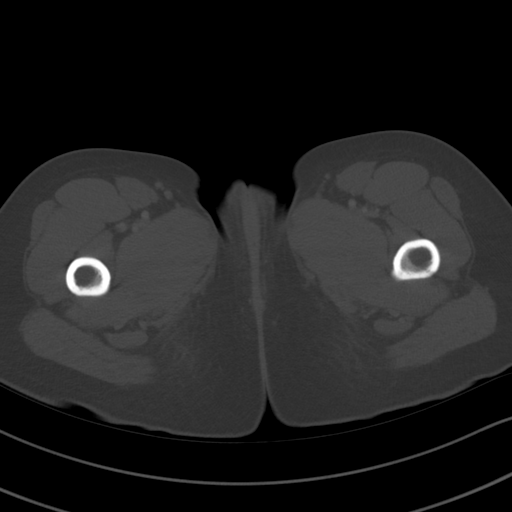
[im 11/83  soft-tissue]
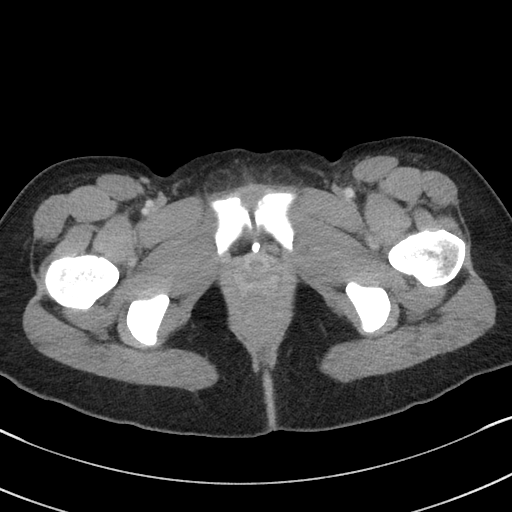
[im 18/83  soft-tissue]
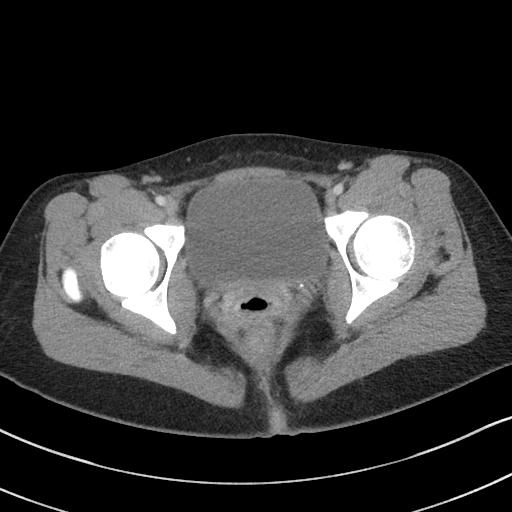
[im 24/83  soft-tissue]
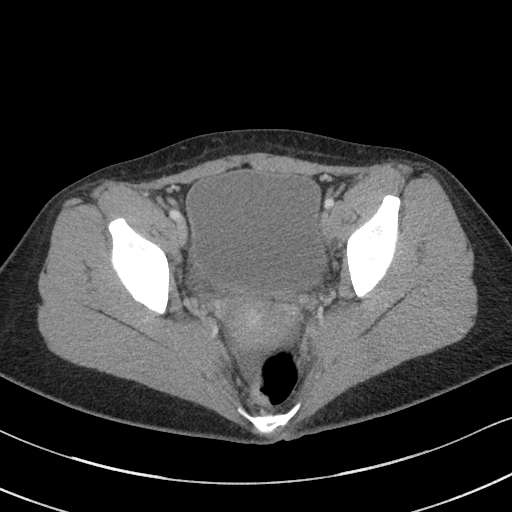
[im 28/83  soft-tissue]
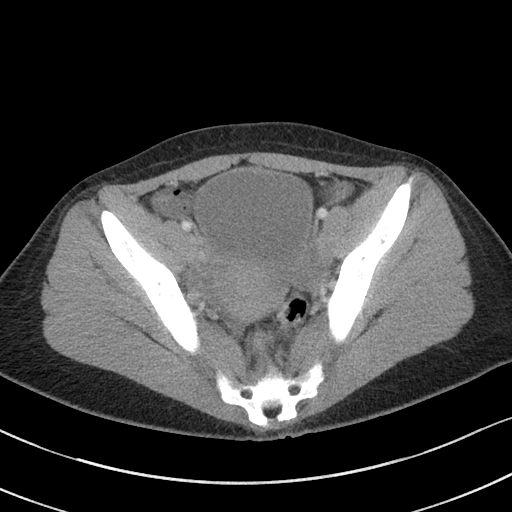
[im 35/83  soft-tissue]
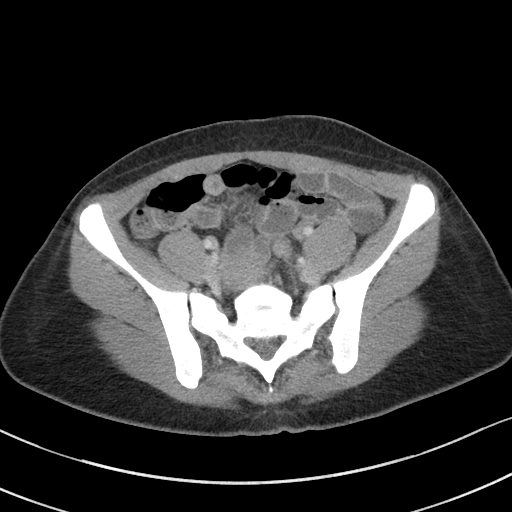
[im 42/83  soft-tissue]
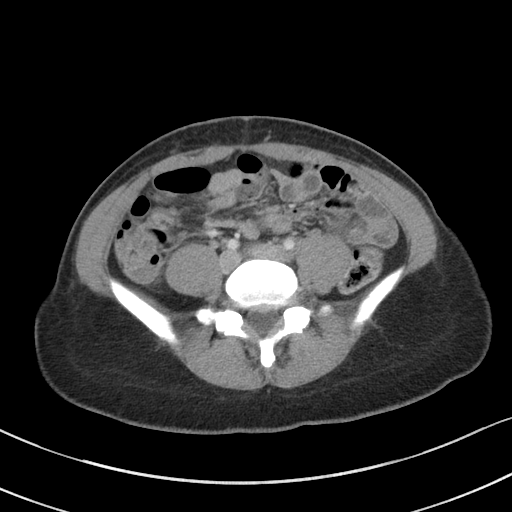
[im 48/83  soft-tissue]
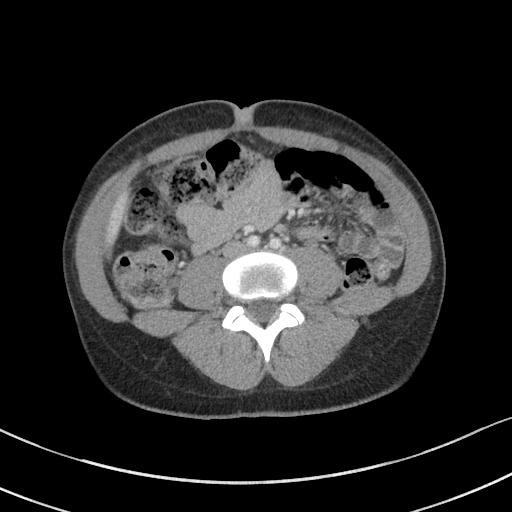
[im 55/83  soft-tissue]
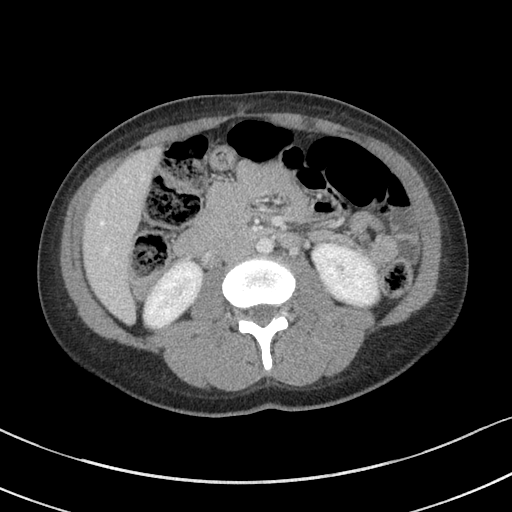
[im 55/83  bone]
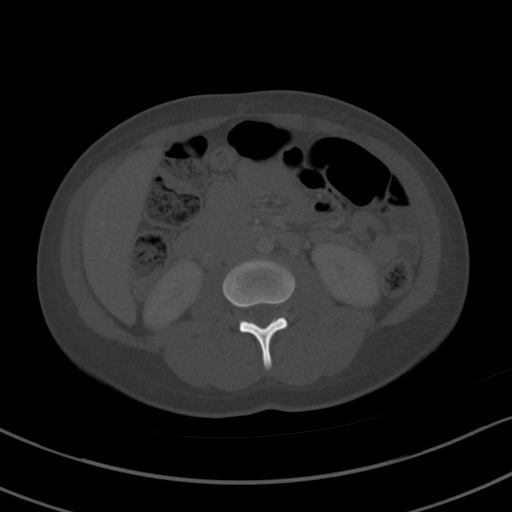
[im 59/83  soft-tissue]
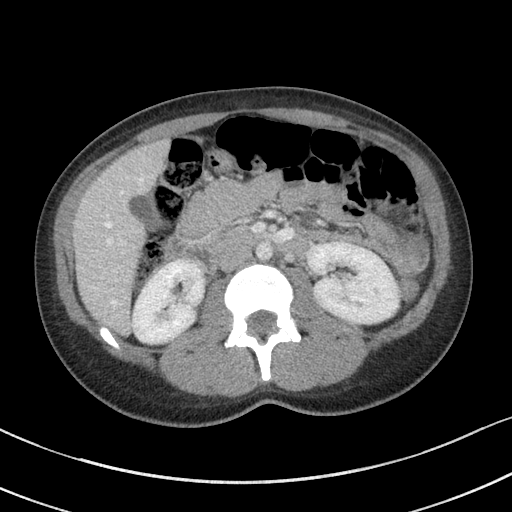
[im 65/83  soft-tissue]
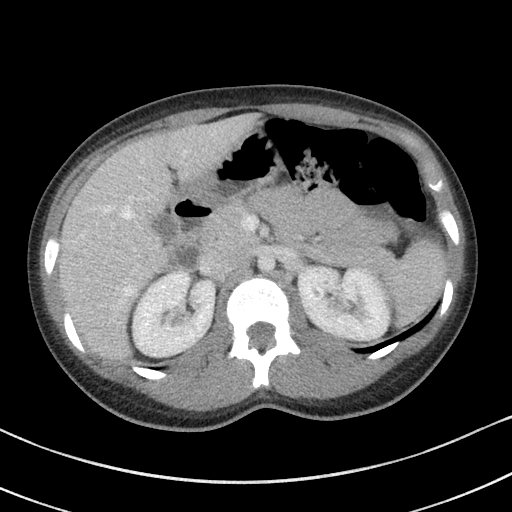
[im 72/83  soft-tissue]
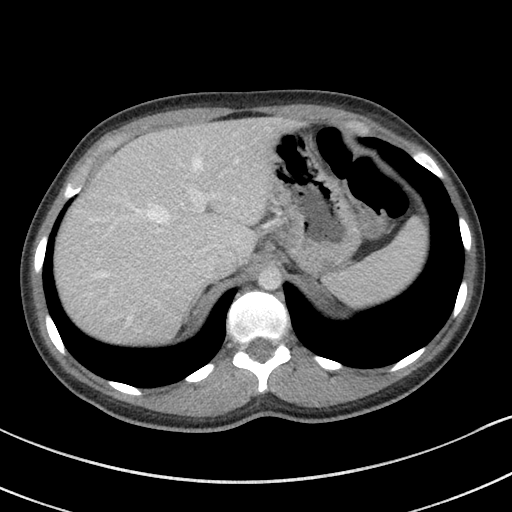
[im 79/83  soft-tissue]
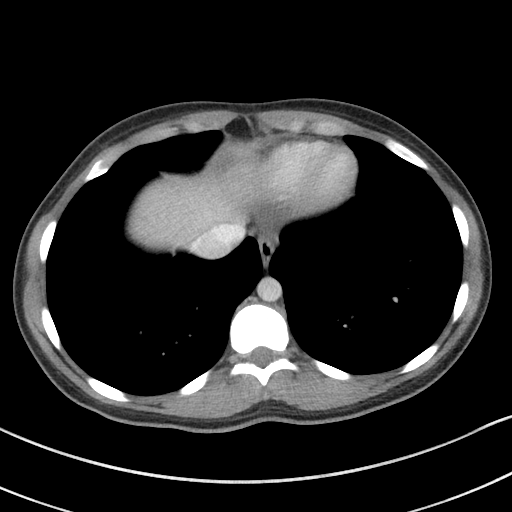

[Series 6: a/p w/ cor · coronal · 0.66mm/px · 3 of 111 slices shown]
[im 37/111  soft-tissue]
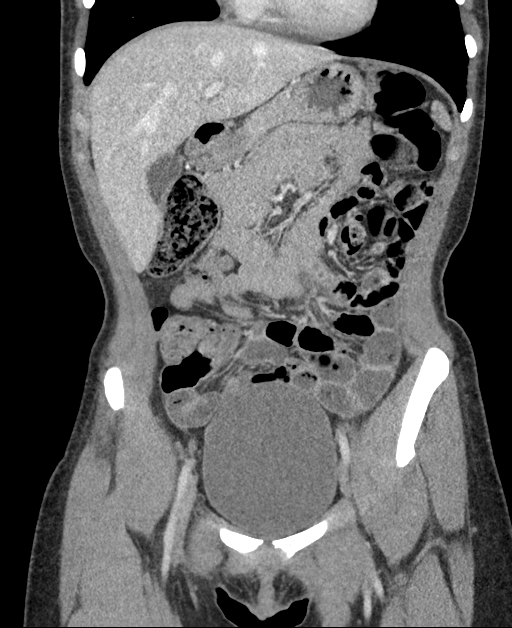
[im 49/111  soft-tissue]
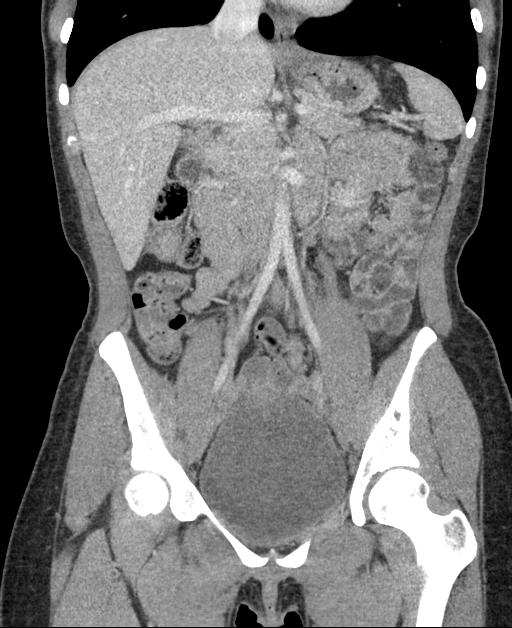
[im 62/111  soft-tissue]
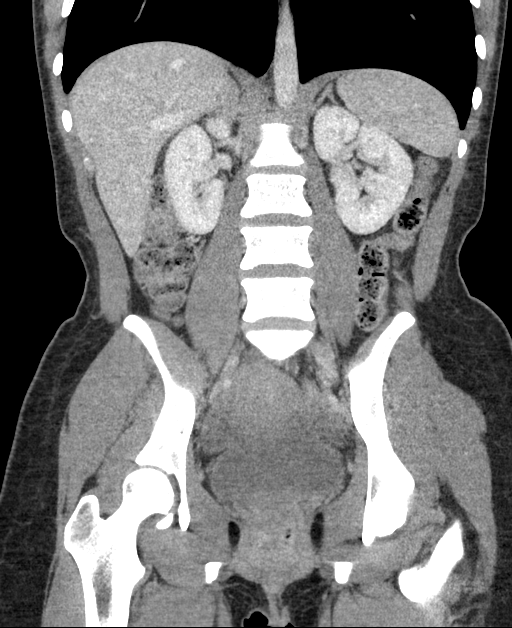

[16 of 46 positions shown; findings below may reference images not displayed]

RADIATION DOSE REDUCTION: This exam was performed according to the
departmental dose-optimization program which includes automated
exposure control, adjustment of the mA and/or kV according to
patient size and/or use of iterative reconstruction technique.

CONTRAST:  100mL OMNIPAQUE IOHEXOL 300 MG/ML  SOLN
FINDINGS: Lower chest: No acute abnormality.

Hepatobiliary: No focal liver abnormality is seen. No gallstones,
gallbladder wall thickening, or biliary dilatation.

Pancreas: Unremarkable. No pancreatic ductal dilatation or
surrounding inflammatory changes.

Spleen: Normal in size without focal abnormality.

Adrenals/Urinary Tract: Bilateral adrenal glands are unremarkable.
Punctate nonobstructing stone of the upper pole of the left kidney.
Bladder is unremarkable.

Stomach/Bowel: Stomach is within normal limits. Appendix appears
normal. No evidence of bowel wall thickening, distention, or
inflammatory changes.

Vascular/Lymphatic: Aortic atherosclerosis. No enlarged abdominal or
pelvic lymph nodes.

Reproductive: Uterus and bilateral adnexa are unremarkable.

Other: No abdominal wall hernia or abnormality. No abdominopelvic
ascites.

Musculoskeletal: No acute or significant osseous findings.
IMPRESSION: No acute findings in the abdomen or pelvis.

## 2023-10-10 IMAGING — US US PELVIS COMPLETE TRANSABD/TRANSVAG W DUPLEX AND/OR DOPPLER
1 series · 13 of 25 positions shown · non-contrast
Comparison: None

CLINICAL DATA: Lower back pain, lower abdominal pain and pelvic
pain since December 2020, vaginal bleeding, on Depo shots

EXAM:
TRANSABDOMINAL AND TRANSVAGINAL ULTRASOUND OF PELVIS
DOPPLER ULTRASOUND OF OVARIES
TECHNIQUE: Both transabdominal and transvaginal ultrasound examinations of the
pelvis were performed. Transabdominal technique was performed for
global imaging of the pelvis including uterus, ovaries, adnexal
regions, and pelvic cul-de-sac.
It was necessary to proceed with endovaginal exam following the
transabdominal exam to visualize the uterus, endometrium, and
adnexa. Color and duplex Doppler ultrasound was utilized to evaluate
blood flow to the ovaries.

[Series 1: us pelvic complete w transvaginal and torsion righ · 13 of 137 slices shown]
[im 1/137]
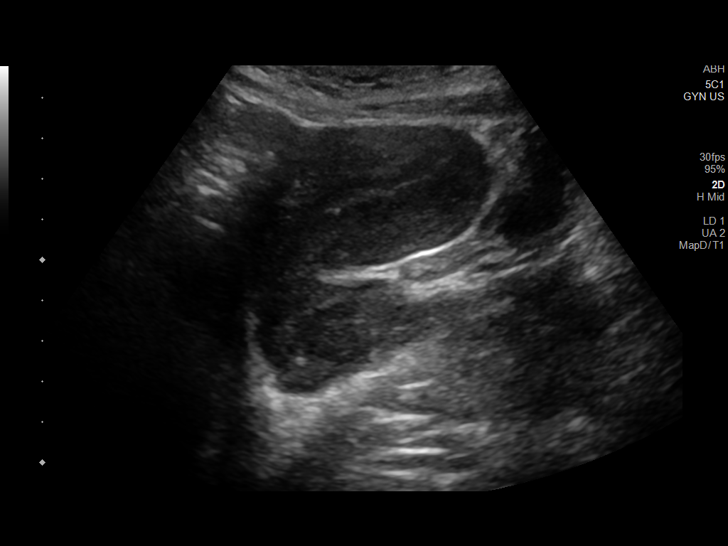
[im 12/137]
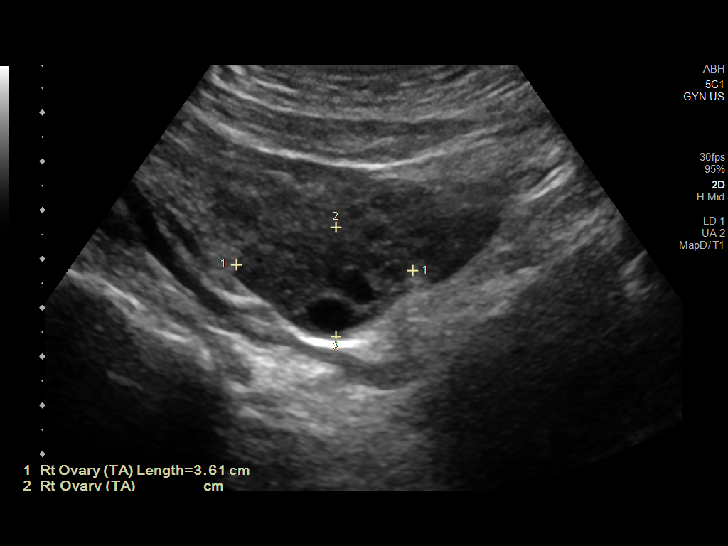
[im 23/137]
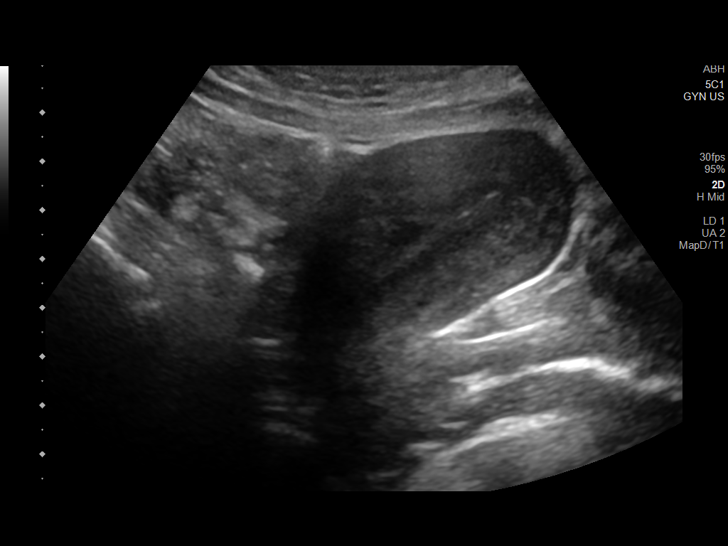
[im 35/137]
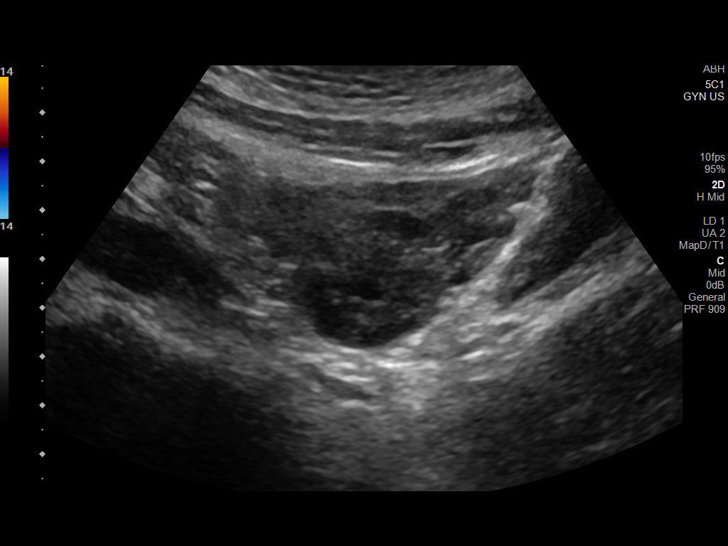
[im 46/137]
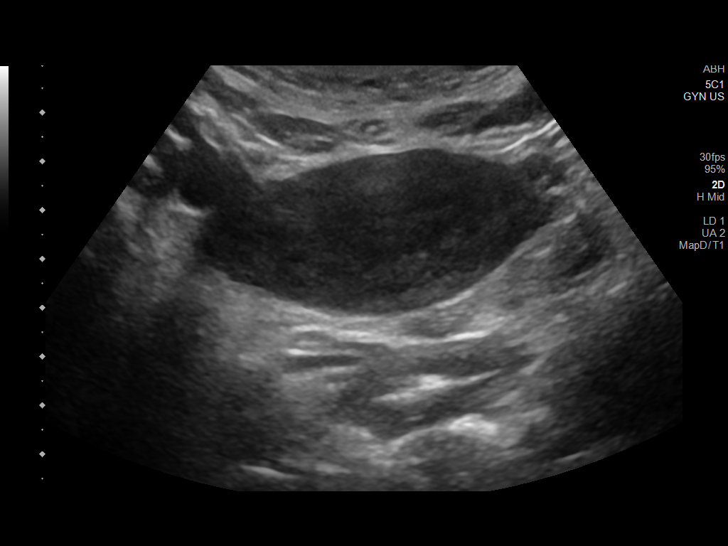
[im 57/137]
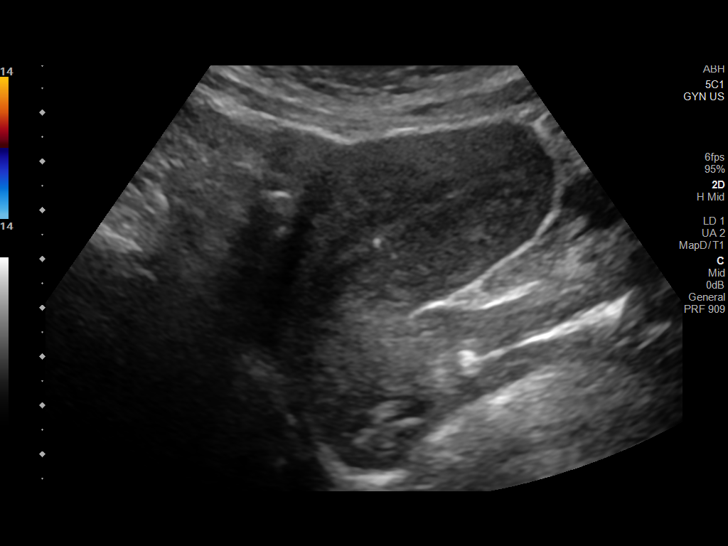
[im 69/137]
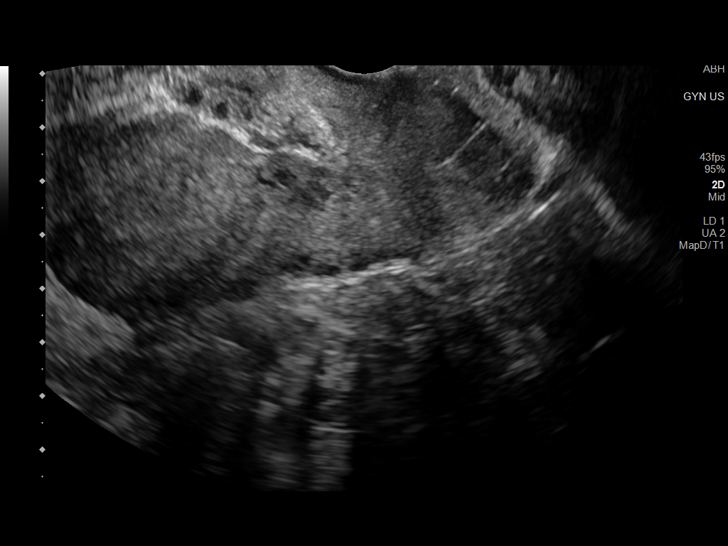
[im 80/137]
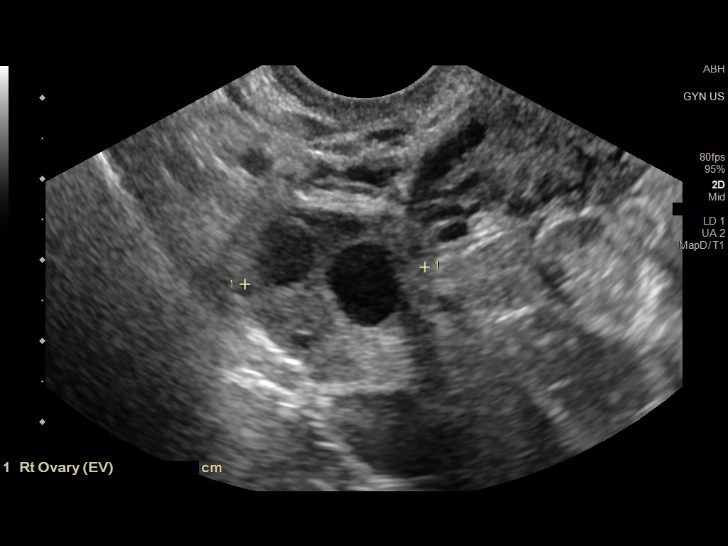
[im 91/137]
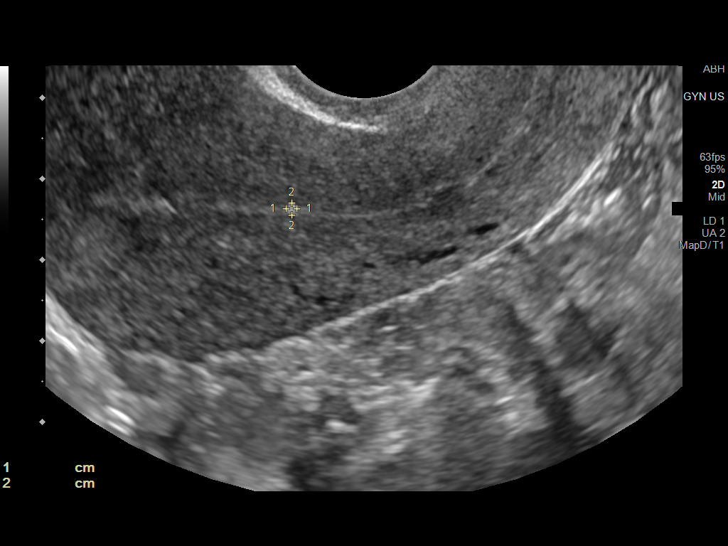
[im 103/137]
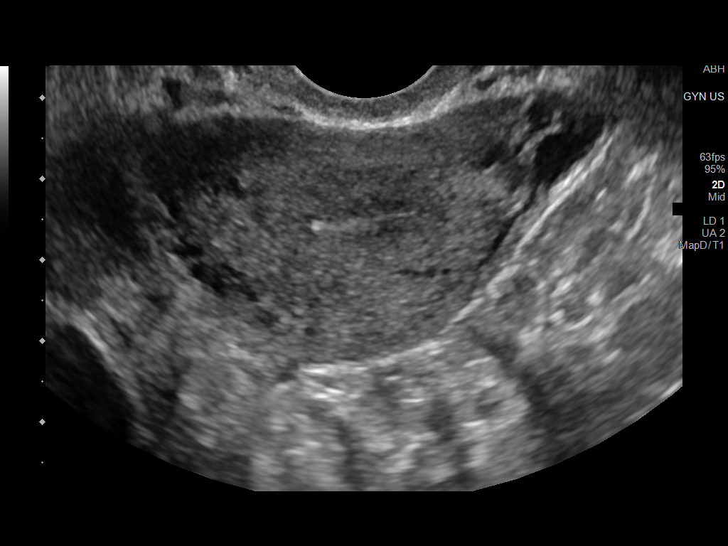
[im 114/137]
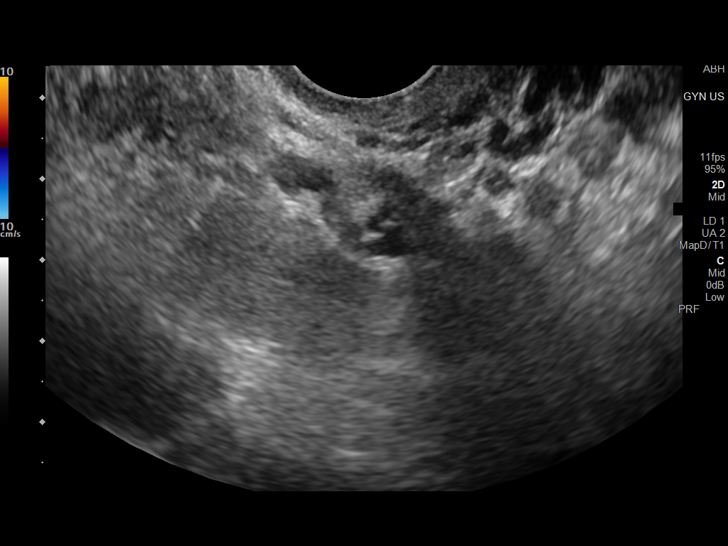
[im 125/137]
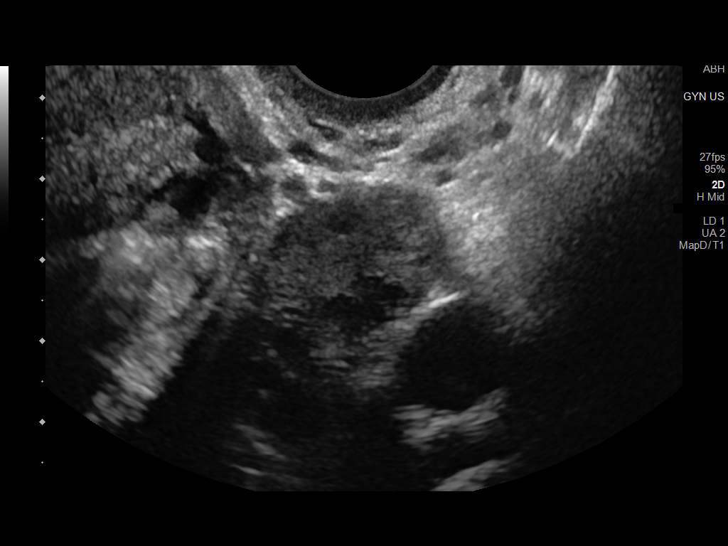
[im 137/137]
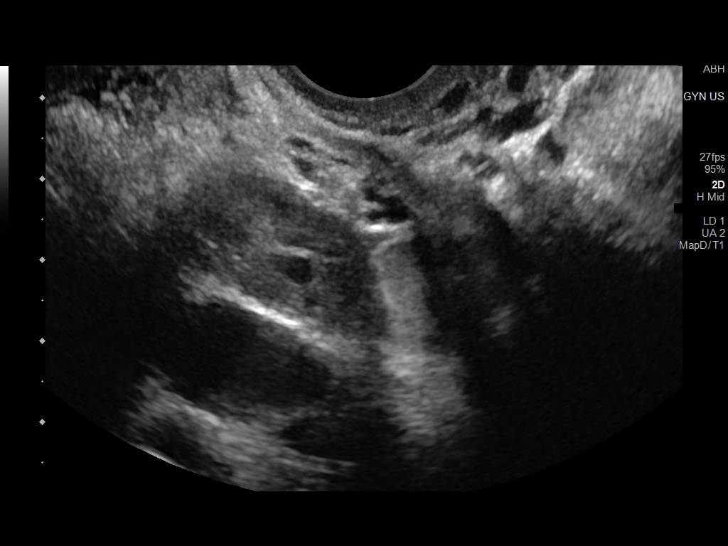

[13 of 25 positions shown; findings below may reference images not displayed]

FINDINGS: Uterus

Measurements: 8.7 x 3.6 x 4.6 cm = volume: 77 mL. Anteverted. Normal
morphology without mass

Endometrium

Thickness: 5 mm. Single tiny basilar calcification at mid uterus. No
endometrial mass or fluid.

Right ovary

Measurements: 4.0 x 1.7 x 2.2 cm = volume: 7.7 mL. Normal morphology
without mass. Internal blood flow present on color Doppler imaging.

Left ovary

Measurements: 3.2 x 1.8 x 2.0 cm = volume: 6.0 mL. Normal morphology
without mass. Internal blood flow present on color Doppler imaging.

Pulsed Doppler evaluation of both ovaries demonstrates normal
low-resistance arterial and venous waveforms.

Other findings

No free pelvic fluid or adnexal masses.
IMPRESSION: Normal exam.
